# Patient Record
Sex: Female | Born: 1935 | Race: White | Hispanic: Yes | Marital: Single | Smoking: Never smoker
Health system: Southern US, Community
[De-identification: ages and names within clinical notes are randomized; demographics above are authoritative.]

## PROBLEM LIST (undated history)

## (undated) DIAGNOSIS — I10 Essential (primary) hypertension: Secondary | ICD-10-CM

## (undated) DIAGNOSIS — J690 Pneumonitis due to inhalation of food and vomit: Secondary | ICD-10-CM

## (undated) DIAGNOSIS — B372 Candidiasis of skin and nail: Secondary | ICD-10-CM

## (undated) DIAGNOSIS — Z9981 Dependence on supplemental oxygen: Secondary | ICD-10-CM

## (undated) DIAGNOSIS — J9691 Respiratory failure, unspecified with hypoxia: Secondary | ICD-10-CM

## (undated) DIAGNOSIS — L309 Dermatitis, unspecified: Secondary | ICD-10-CM

## (undated) DIAGNOSIS — R1310 Dysphagia, unspecified: Secondary | ICD-10-CM

## (undated) DIAGNOSIS — F0150 Vascular dementia without behavioral disturbance: Secondary | ICD-10-CM

## (undated) DIAGNOSIS — I739 Peripheral vascular disease, unspecified: Secondary | ICD-10-CM

## (undated) HISTORY — PX: VASCULAR SURGERY: SHX849

## (undated) HISTORY — DX: Dysphagia, unspecified: R13.10

## (undated) HISTORY — DX: Peripheral vascular disease, unspecified: I73.9

## (undated) HISTORY — DX: Candidiasis of skin and nail: B37.2

## (undated) HISTORY — DX: Dependence on supplemental oxygen: Z99.81

## (undated) HISTORY — DX: Pneumonitis due to inhalation of food and vomit: J69.0

## (undated) HISTORY — DX: Dermatitis, unspecified: L30.9

## (undated) HISTORY — DX: Essential (primary) hypertension: I10

## (undated) HISTORY — DX: Vascular dementia, unspecified severity, without behavioral disturbance, psychotic disturbance, mood disturbance, and anxiety: F01.50

## (undated) HISTORY — DX: Respiratory failure, unspecified with hypoxia: J96.91

---

## 2011-10-29 ENCOUNTER — Emergency Department (HOSPITAL_COMMUNITY)
Admission: EM | Admit: 2011-10-29 | Discharge: 2011-10-30 | Disposition: A | Discharge status: Home / Self Care | Payer: Medicare Other | Attending: Emergency Medicine | Admitting: Emergency Medicine

## 2011-10-29 ENCOUNTER — Encounter (HOSPITAL_COMMUNITY): Payer: Self-pay | Admitting: Emergency Medicine

## 2011-10-29 DIAGNOSIS — F29 Unspecified psychosis not due to a substance or known physiological condition: Secondary | ICD-10-CM | POA: Insufficient documentation

## 2011-10-29 DIAGNOSIS — F17200 Nicotine dependence, unspecified, uncomplicated: Secondary | ICD-10-CM | POA: Insufficient documentation

## 2011-10-29 DIAGNOSIS — I10 Essential (primary) hypertension: Secondary | ICD-10-CM | POA: Insufficient documentation

## 2011-10-29 DIAGNOSIS — F0390 Unspecified dementia without behavioral disturbance: Secondary | ICD-10-CM | POA: Insufficient documentation

## 2011-10-29 DIAGNOSIS — Z7982 Long term (current) use of aspirin: Secondary | ICD-10-CM | POA: Insufficient documentation

## 2011-10-29 HISTORY — DX: Essential (primary) hypertension: I10

## 2011-10-29 LAB — URINALYSIS, ROUTINE W REFLEX MICROSCOPIC
Glucose, UA: NEGATIVE mg/dL
Hgb urine dipstick: NEGATIVE
Protein, ur: NEGATIVE mg/dL
pH: 7 (ref 5.0–8.0)

## 2011-10-29 LAB — CBC
Hemoglobin: 14.2 g/dL (ref 12.0–15.0)
MCH: 31.9 pg (ref 26.0–34.0)
MCV: 95.1 fL (ref 78.0–100.0)
RBC: 4.45 MIL/uL (ref 3.87–5.11)

## 2011-10-29 LAB — POCT I-STAT, CHEM 8
BUN: 20 mg/dL (ref 6–23)
Calcium, Ion: 1.13 mmol/L (ref 1.12–1.32)
Chloride: 105 mEq/L (ref 96–112)
Glucose, Bld: 138 mg/dL — ABNORMAL HIGH (ref 70–99)
TCO2: 31 mmol/L (ref 0–100)

## 2011-10-29 LAB — DIFFERENTIAL
Eosinophils Absolute: 0.2 10*3/uL (ref 0.0–0.7)
Eosinophils Relative: 2 % (ref 0–5)
Lymphs Abs: 2.3 10*3/uL (ref 0.7–4.0)
Monocytes Relative: 8 % (ref 3–12)
Neutrophils Relative %: 67 % (ref 43–77)

## 2011-10-29 LAB — URINE MICROSCOPIC-ADD ON

## 2011-10-29 MED ORDER — AMLODIPINE BESYLATE 5 MG PO TABS
5.0000 mg | ORAL_TABLET | ORAL | Status: AC
  Administered 2011-10-30: 5 mg via ORAL
  Filled 2011-10-29: qty 1

## 2011-10-29 MED ORDER — METOPROLOL TARTRATE 25 MG PO TABS
12.5000 mg | ORAL_TABLET | Freq: Two times a day (BID) | ORAL | Status: DC
  Administered 2011-10-30: via ORAL
  Administered 2011-10-30: 25 mg via ORAL
  Filled 2011-10-29 (×2): qty 1

## 2011-10-29 MED ORDER — RISPERIDONE 0.5 MG PO TABS
0.5000 mg | ORAL_TABLET | ORAL | Status: AC
  Administered 2011-10-30: 0.5 mg via ORAL
  Filled 2011-10-29: qty 1

## 2011-10-29 NOTE — ED Notes (Signed)
Pt's daughter st's she brought pt to [place] from [place] where she lives due to pt having dementia, will not take B/P meds.  Pt becoming more confused.  Pt combative at times.  St's pt will take her meds then will turn her head and spit them out. Pt's daughter wishes to have pt placed in appropriate facility.

## 2011-10-29 NOTE — ED Notes (Signed)
Spoke with pt and her dtr.  Dtr/family unable to provide 24 hour care for pt and are concerned for her health/safety.  Dtr. Particularly worried about pt's high blood pressure/having a stroke.  Pt lived in a senior income adjusted apt complex in [place], pta.  Pt believes that the complex was a movie set and that people who lived there were actors in a movie.  Pt also believes someone was stealing food from her (she wasn't buying any) and a neighbor got inside of her body.  CSW rec. ALF placement for pt at d/c.  ALF list given to pt's dtr. Pt has [place] Medicaid and will need to have it switched to [place], in order for her to have benefits here, dtr. Informed on how to do this.  CSW will follow in house for placement, if admitted.

## 2011-10-29 NOTE — ED Notes (Signed)
Nursing comuntication completed- charted in error.

## 2011-10-29 NOTE — ED Provider Notes (Signed)
History     CSN: [id number]  Arrival date & time 10/29/11  1252   None     Chief Complaint  Patient presents with  . Hypertension    (Consider location/radiation/quality/duration/timing/severity/associated sxs/prior treatment) HPI This 78 year old female was brought from [place] she was in the hospital for confusion which appears long-standing likely for months with weight loss likely over 10 pounds for one several months with inability to care for herself at home according to the family, apparently when she was in her called the police almost on a daily basis concerned about break-ins people stealing her food, return to the family the patient may not be spending much if any money and food in the first place her although she keeps it home has been losing weight. There is been no acute change the patient's mental status. She remains pleasantly confused. She has very poor short-term memory. She denies headache chest pain palpitations cough shortness breath abdominal pain vomiting diarrhea or focal neurologic symptoms. The family took the patient AGAINST MEDICAL ADVICE of the [place] hospital to move to [place] where they could find assisted living placement for dementia patients in [place]. The family does not recall ever talking to social work or care management in [place] regarding placement for the patient they do notice it appears unsafe for her to try to return home alone. Past Medical History  Diagnosis Date  . Hypertension   Dementia  Past Surgical History  Procedure Date  . Vascular surgery     No family history on file.  History  Substance Use Topics  . Smoking status: Current Some Day Smoker  . Smokeless tobacco: Not on file  . Alcohol Use: No    OB History    Grav Para Term Preterm Abortions TAB SAB Ect Mult Living                  Review of Systems  Constitutional: Negative for fever.       10 Systems reviewed and are negative for acute change except as  noted in the HPI.  HENT: Negative for congestion.   Eyes: Negative for discharge and redness.  Respiratory: Negative for cough and shortness of breath.   Cardiovascular: Negative for chest pain.  Gastrointestinal: Negative for vomiting and abdominal pain.  Musculoskeletal: Negative for back pain.  Skin: Negative for rash.  Neurological: Negative for syncope, numbness and headaches.  Psychiatric/Behavioral:       No behavior change.    Allergies  Sulfa drugs cross reactors  Home Medications   Current Outpatient Rx  Name Route Sig Dispense Refill  . AMLODIPINE BESYLATE 5 MG PO TABS Oral Take 5 mg by mouth daily.    . ASPIRIN EC 81 MG PO TBEC Oral Take 81 mg by mouth daily.    Marland Kitchen METOPROLOL TARTRATE 25 MG PO TABS Oral Take 12.5 mg by mouth 2 (two) times daily.    . ADULT MULTIVITAMIN W/MINERALS CH Oral Take 1 tablet by mouth daily.    . QUETIAPINE FUMARATE 25 MG PO TABS Oral Take 25 mg by mouth at bedtime.      BP 131/91  Pulse 86  Temp(Src) 98.6 F (37 C) (Oral)  Resp 18  SpO2 95%  Physical Exam  Nursing note and vitals reviewed. Constitutional:       Awake, alert, nontoxic appearance with baseline speech for patient.  HENT:  Head: Atraumatic.  Mouth/Throat: No oropharyngeal exudate.  Eyes: EOM are normal. Pupils are equal,  round, and reactive to light. Right eye exhibits no discharge. Left eye exhibits no discharge.  Neck: Neck supple.  Cardiovascular: Normal rate and regular rhythm.   No murmur heard. Pulmonary/Chest: Effort normal and breath sounds normal. No stridor. No respiratory distress. She has no wheezes. She has no rales. She exhibits no tenderness.  Abdominal: Soft. Bowel sounds are normal. She exhibits no mass. There is no tenderness. There is no rebound.  Musculoskeletal: She exhibits no tenderness.       Baseline ROM, moves extremities with no obvious new focal weakness.  Lymphadenopathy:    She has no cervical adenopathy.  Neurological: She is alert.        Awake, alert, cooperative poor short-term memory and does not remember how long she has been [place] or how long she was in the hospital in [place]; motor strength bilaterally; sensation normal to light touch bilaterally; peripheral visual fields full to confrontation; no facial asymmetry; tongue midline; major cranial nerves appear intact; no pronator drift, normal finger to nose bilaterally  Skin: No rash noted.  Psychiatric: She has a normal mood and affect.    ED Course  Procedures (including critical care time) D/w SW, Care Mgmt, TelePsych, & [facility], family given resources from SW but unable to make safe arrangements for discharge and family with more questions for SW so feel reasonable to keep in ED until SW can re-assess in AM, re-started Pt's new BP meds from [place] and Risperdal per TelePsych recs, family states [place] had her on Seroquel a couple nights which helped agitation too.  In ED Pt intermittently anxious and agitated but repeatedly verbally reassured and re-directed by family and ED staff.  Patient / Family / Caregiver informed of clinical course, understand medical decision-making process, and agree with plan.Pt stable in ED with no significant deterioration in condition.0030 Labs Reviewed  URINALYSIS, ROUTINE W REFLEX MICROSCOPIC - Abnormal; Notable for the following:    Leukocytes, UA TRACE (*)    All other components within normal limits  POCT I-STAT, CHEM 8 - Abnormal; Notable for the following:    Glucose, Bld 138 (*)    All other components within normal limits  CBC  DIFFERENTIAL  URINE MICROSCOPIC-ADD ON   No results found.   1. Dementia       MDM  I doubt any other EMC precluding discharge at this time including, but not necessarily limited to the following:CVA, SBI.        Hurman Horn, MD 10/30/11 2216

## 2011-10-29 NOTE — Progress Notes (Signed)
Consult for placement has been referred to the appropriate department of social work.

## 2011-10-29 NOTE — ED Notes (Signed)
Care assumed. Pt standing by the side of bed talking.family at bs. No c/o any. Vs stable.

## 2011-10-29 NOTE — ED Notes (Signed)
Pt here with high blood pressure and some abnormal behavior according to family pt has been seen [facility] in [place] for possible [employer]

## 2011-10-30 MED ORDER — ZIPRASIDONE MESYLATE 20 MG IM SOLR
10.0000 mg | Freq: Once | INTRAMUSCULAR | Status: AC
  Administered 2011-10-30: 10 mg via INTRAMUSCULAR
  Filled 2011-10-30: qty 20

## 2011-10-30 NOTE — ED Notes (Signed)
Patient again got out of bed, redirected patient to bed

## 2011-10-30 NOTE — ED Notes (Signed)
Spoke w/Hannah, SW, who advised there is a facility who is looking over pt's records and she will contact when she hears something.  Also, states family is on there way to ED.

## 2011-10-30 NOTE — ED Provider Notes (Signed)
Medical screening examination/treatment/procedure(s) were performed by non-physician practitioner and as supervising physician I was immediately available for consultation/collaboration.   Joya Gaskins, MD 10/30/11 [phone]

## 2011-10-30 NOTE — ED Notes (Signed)
Patient attempting to get out of bed, patient was re-oriented and is now eating breakfast.

## 2011-10-30 NOTE — ED Notes (Signed)
Clinical Social Worker made aware that patient remains in the ED and in need of placement.  Reviewed chart and spoke with CM.  Unclear why patient brought down from [place] and unclear why family cannot take care of her.  Attempted to call patient daughter who had phone turned off but CSW left message. Will await call back to discuss patient status as well has disposition.  CSW Augusto Gamble has already met with the family and explained why patient cannot be placed due to her Medicaid.  Patient has out of state Medicaid and cannot be place in a SNF at this time.  ALF was discussed and patient family given ALF list.  Will continue to try and reach family who will have to be the decision maker because of patient's dementia.  MD aware of situation and will continue to work and find appropriate placement for patient.  Ashley Jacobs, MSW LCSW [phone]

## 2011-10-30 NOTE — ED Notes (Signed)
Gavin Pound, sitter at bedside.

## 2011-10-30 NOTE — ED Notes (Signed)
Patient sleeping. No complaints at this time.

## 2011-10-30 NOTE — ED Provider Notes (Signed)
Patient with a hx sig for hypertension was placed in CDU  by Dr. Fonnie Jarvis (from [facility]). Patient care resumed from Dr. Read Drivers .  Patient is here for lack of placement and has been here for observation pending social work consult in the morning. Note that the family took the patient AGAINST MEDICAL ADVICE from [place] hospital to move to [place] where they could find assisted living placement for dementia patients in [place]. Her ins and papers are still for [place] and pt has NOT need registered here for medicare.While in obeservation over night the pt slept well and had no complaints, per nursing staff. Patient re-evaluated and is sleeping comfortable, VSS, no concerns at this time.  Plan per previous provider is to see if social work can assist in finding placement for patient due to family concern for health/safety, and not being comfortable taking patient home. On exam: hemodynamically stable, NAD, heart w/ RRR, lungs CTAB, Chest & abd non-tender, no peripheral edema or calf tenderness. Pt with baseline dementia.   BP 125/50  Pulse 66  Temp(Src) 98.6 F (37 C) (Oral)  Resp 16  SpO2 99%   9:09 AM  Social Work re-paged   10:00 AM Spoke with Dahlia Client the social worker who has a Nursing Home currently looking over pts paper wk for possible placement.   11:00Am Family coming to ED  12:00 Holding off on getting pt admitted. Dr. Freida Busman says that if pt in unable to be placed administration can be contacted to get pt admitted. Placemnt still pending. Pt stable with sitter.   BP 128/79  Pulse 75  Temp(Src) 98.6 F (37 C) (Oral)  Resp 16  SpO2 99%    1:13 PM Placement by 5:00pm or need to get pt admitted, social worker to report back to me by 2:00PM   2:54 PM Social worker unable to place patient patient's family has been given followup care with PCP paperwork as well as nursing homes placement papers.  Family will take patient home and schedule followup as well as find placement for their  mother.  The family is agreeable with this plan patient is hemodynamically stable and in no acute distress prior to discharge.    Jaci Carrel, [place] 10/30/11 1455

## 2011-10-30 NOTE — ED Notes (Signed)
Working on a plan with a local facility for placement for patient.  Have completed referral and hopeful for placement under patient's medicare because we was admitted at [facility].  Will follow up once patient daughter arrives and if facility will accept patient. If facility declines patient because of Medicare then Medicaid will have to be switched over before she can be placed, thus patient daughter will have to take her home.  Will follow up.    Call if you have questions.  Ashley Jacobs, MSW LCSW [phone]

## 2011-10-30 NOTE — ED Notes (Signed)
Patient daughter returned call back.  She is very frazzled and upset with the status of her mother.  She will be coming to the hospital to speak with CSW around 11 am.  At that time will discuss all of patient's options with daughter.  Will follow up.  Ashley Jacobs, MSW LCSW [phone]

## 2011-10-30 NOTE — ED Notes (Signed)
Breakfast ordered 

## 2011-10-30 NOTE — ED Notes (Signed)
Met with patient's daughter and son.  Patient will not be able to be placed due to insurance at this time and no payer source.  Liaison for nursing home also completed meeting with CSW and discussed options as well as reason for denial.    Daughter given information about switching her Medicaid over to [facility] and also an application.  Along with medicaid resources, daughter was given Management consultant number to find patient a PCP and list of ALF, SNF, and private duty sitters.    Plan for patient is to return home with daughter and son at this time until medicaid can be switched, she has been appropriate level of care identified with PCP. PA and RN have been made aware and no other needs at this time.  Anticipate dc this evening.  Ashley Jacobs, MSW LCSW [phone]

## 2011-10-30 NOTE — Discharge Instructions (Signed)
Use the resource guide given do to help he find placement for your family member.

## 2011-10-30 NOTE — ED Notes (Signed)
Lew Dawes, Consulting civil engineer, aware of pt remaining in ED.  Advised she will have Diplomatic Services operational officer page day SW.

## 2012-05-02 ENCOUNTER — Ambulatory Visit
Admission: RE | Admit: 2012-05-02 | Discharge: 2012-05-02 | Disposition: A | Discharge status: Home / Self Care | Payer: No Typology Code available for payment source | Source: Ambulatory Visit | Attending: Family Medicine | Admitting: Family Medicine

## 2012-05-02 ENCOUNTER — Other Ambulatory Visit: Payer: Self-pay | Admitting: Family Medicine

## 2012-05-02 DIAGNOSIS — M549 Dorsalgia, unspecified: Secondary | ICD-10-CM

## 2013-08-25 ENCOUNTER — Encounter (HOSPITAL_COMMUNITY): Payer: Self-pay | Admitting: Emergency Medicine

## 2013-08-25 ENCOUNTER — Emergency Department (HOSPITAL_COMMUNITY): Payer: Medicare (Managed Care)

## 2013-08-25 ENCOUNTER — Emergency Department (HOSPITAL_COMMUNITY)
Admission: EM | Admit: 2013-08-25 | Discharge: 2013-08-25 | Disposition: A | Discharge status: Home / Self Care | Payer: Medicare (Managed Care) | Attending: Emergency Medicine | Admitting: Emergency Medicine

## 2013-08-25 DIAGNOSIS — W19XXXA Unspecified fall, initial encounter: Secondary | ICD-10-CM | POA: Insufficient documentation

## 2013-08-25 DIAGNOSIS — F0390 Unspecified dementia without behavioral disturbance: Secondary | ICD-10-CM | POA: Insufficient documentation

## 2013-08-25 DIAGNOSIS — R05 Cough: Secondary | ICD-10-CM | POA: Insufficient documentation

## 2013-08-25 DIAGNOSIS — Z79899 Other long term (current) drug therapy: Secondary | ICD-10-CM | POA: Insufficient documentation

## 2013-08-25 DIAGNOSIS — F209 Schizophrenia, unspecified: Secondary | ICD-10-CM | POA: Insufficient documentation

## 2013-08-25 DIAGNOSIS — E876 Hypokalemia: Secondary | ICD-10-CM

## 2013-08-25 DIAGNOSIS — R7989 Other specified abnormal findings of blood chemistry: Secondary | ICD-10-CM | POA: Insufficient documentation

## 2013-08-25 DIAGNOSIS — I10 Essential (primary) hypertension: Secondary | ICD-10-CM | POA: Insufficient documentation

## 2013-08-25 DIAGNOSIS — M47817 Spondylosis without myelopathy or radiculopathy, lumbosacral region: Secondary | ICD-10-CM | POA: Insufficient documentation

## 2013-08-25 DIAGNOSIS — R531 Weakness: Secondary | ICD-10-CM

## 2013-08-25 DIAGNOSIS — F17200 Nicotine dependence, unspecified, uncomplicated: Secondary | ICD-10-CM | POA: Insufficient documentation

## 2013-08-25 DIAGNOSIS — R5381 Other malaise: Secondary | ICD-10-CM | POA: Insufficient documentation

## 2013-08-25 DIAGNOSIS — R932 Abnormal findings on diagnostic imaging of liver and biliary tract: Secondary | ICD-10-CM | POA: Insufficient documentation

## 2013-08-25 DIAGNOSIS — M47816 Spondylosis without myelopathy or radiculopathy, lumbar region: Secondary | ICD-10-CM

## 2013-08-25 DIAGNOSIS — Y92009 Unspecified place in unspecified non-institutional (private) residence as the place of occurrence of the external cause: Secondary | ICD-10-CM | POA: Insufficient documentation

## 2013-08-25 DIAGNOSIS — Y939 Activity, unspecified: Secondary | ICD-10-CM | POA: Insufficient documentation

## 2013-08-25 DIAGNOSIS — R296 Repeated falls: Secondary | ICD-10-CM

## 2013-08-25 DIAGNOSIS — R059 Cough, unspecified: Secondary | ICD-10-CM | POA: Insufficient documentation

## 2013-08-25 DIAGNOSIS — Z7982 Long term (current) use of aspirin: Secondary | ICD-10-CM | POA: Insufficient documentation

## 2013-08-25 LAB — CBC WITH DIFFERENTIAL/PLATELET
Basophils Relative: 0 % (ref 0–1)
Eosinophils Absolute: 0.1 10*3/uL (ref 0.0–0.7)
Eosinophils Relative: 1 % (ref 0–5)
HCT: 36.4 % (ref 36.0–46.0)
Hemoglobin: 11.7 g/dL — ABNORMAL LOW (ref 12.0–15.0)
MCH: 30.5 pg (ref 26.0–34.0)
MCHC: 32.1 g/dL (ref 30.0–36.0)
MCV: 95 fL (ref 78.0–100.0)
Monocytes Absolute: 0.9 10*3/uL (ref 0.1–1.0)
Monocytes Relative: 9 % (ref 3–12)

## 2013-08-25 LAB — URINALYSIS, ROUTINE W REFLEX MICROSCOPIC
Bilirubin Urine: NEGATIVE
Glucose, UA: NEGATIVE mg/dL
Ketones, ur: 15 mg/dL — AB
pH: 7.5 (ref 5.0–8.0)

## 2013-08-25 LAB — COMPREHENSIVE METABOLIC PANEL
Albumin: 3.2 g/dL — ABNORMAL LOW (ref 3.5–5.2)
BUN: 19 mg/dL (ref 6–23)
Chloride: 99 mEq/L (ref 96–112)
Creatinine, Ser: 0.72 mg/dL (ref 0.50–1.10)
Total Bilirubin: 0.8 mg/dL (ref 0.3–1.2)

## 2013-08-25 LAB — URINE MICROSCOPIC-ADD ON

## 2013-08-25 MED ORDER — POTASSIUM CHLORIDE CRYS ER 20 MEQ PO TBCR
40.0000 meq | EXTENDED_RELEASE_TABLET | Freq: Once | ORAL | Status: AC
  Administered 2013-08-25: 40 meq via ORAL
  Filled 2013-08-25: qty 2

## 2013-08-25 MED ORDER — SODIUM CHLORIDE 0.9 % IV BOLUS (SEPSIS)
500.0000 mL | Freq: Once | INTRAVENOUS | Status: AC
  Administered 2013-08-25: 500 mL via INTRAVENOUS

## 2013-08-25 MED ORDER — POTASSIUM CHLORIDE CRYS ER 20 MEQ PO TBCR: 40.0000 meq | EXTENDED_RELEASE_TABLET | Freq: Once | ORAL | Status: DC

## 2013-08-25 NOTE — ED Provider Notes (Signed)
CSN: [id number]     Arrival date & time 08/25/13  7829 History   First MD Initiated Contact with Patient 08/25/13 [phone]     Chief Complaint  Patient presents with  . Fall  . Hematuria   (Consider location/radiation/quality/duration/timing/severity/associated sxs/prior Treatment) HPI Comments: 79 yo female with dementia, low back pain, htn presents with falls and hematuria.  Pt has had more frequent falls the past few weeks, lives alone in apartment, [facility] has been following to place in NH.  Pt says her lower back pain more severe and the past week her lower legs weak to where it is hard to get around.  She has access to walker.   No head injury.  No fevers.  No dysuria.  Takes her hrs to get up at a time.   Patient is a 79 y.o. female presenting with fall and hematuria. The history is provided by the patient.  Fall Pertinent negatives include no chest pain, no abdominal pain, no headaches and no shortness of breath.  Hematuria Pertinent negatives include no chest pain, no abdominal pain, no headaches and no shortness of breath.    Past Medical History  Diagnosis Date  . Hypertension    Past Surgical History  Procedure Laterality Date  . Vascular surgery     No family history on file. History  Substance Use Topics  . Smoking status: Current Some Day Smoker  . Smokeless tobacco: Not on file  . Alcohol Use: No   OB History   Grav Para Term Preterm Abortions TAB SAB Ect Mult Living                 Review of Systems  Constitutional: Negative for fever and chills.  HENT: Negative for congestion.   Eyes: Negative for visual disturbance.  Respiratory: Positive for cough. Negative for shortness of breath.   Cardiovascular: Negative for chest pain.  Gastrointestinal: Negative for vomiting and abdominal pain.  Genitourinary: Positive for hematuria. Negative for dysuria and flank pain.  Musculoskeletal: Positive for arthralgias and back pain. Negative for neck pain and neck  stiffness.  Skin: Negative for rash.  Neurological: Positive for weakness. Negative for light-headedness, numbness and headaches.    Allergies  Sulfa drugs cross reactors  Home Medications   Current Outpatient Rx  Name  Route  Sig  Dispense  Refill  . amLODipine (NORVASC) 5 MG tablet   Oral   Take 5 mg by mouth daily.         Marland Kitchen aspirin EC 81 MG tablet   Oral   Take 81 mg by mouth daily.         . metoprolol tartrate (LOPRESSOR) 25 MG tablet   Oral   Take 12.5 mg by mouth 2 (two) times daily.         . Multiple Vitamin (MULITIVITAMIN WITH MINERALS) TABS   Oral   Take 1 tablet by mouth daily.         . QUEtiapine (SEROQUEL) 25 MG tablet   Oral   Take 25 mg by mouth at bedtime.          BP 138/53  Pulse 63  Temp(Src) 98.1 F (36.7 C) (Oral)  Resp 16  SpO2 94% Physical Exam  Nursing note and vitals reviewed. Constitutional: She is oriented to person, place, and time. She appears well-developed and well-nourished.  HENT:  Head: Normocephalic and atraumatic.  Eyes: Conjunctivae are normal. Right eye exhibits no discharge. Left eye exhibits no discharge.  Neck: Normal range of motion. Neck supple. No tracheal deviation present.  Cardiovascular: Normal rate and regular rhythm.   Pulmonary/Chest: Effort normal and breath sounds normal.  Abdominal: Soft. She exhibits no distension. There is no tenderness. There is no guarding.  Musculoskeletal: She exhibits no edema.  Neurological: She is alert and oriented to person, place, and time.  Reflex Scores:      Patellar reflexes are 2+ on the right side and 2+ on the left side.      Achilles reflexes are 2+ on the right side and 2+ on the left side. Pleasant mild dementia- knows city and location, unsure her address or date 5+ strength in UE and 4+ LE with f/e at major joints. Sensation to palpation intact in UE and LE. CNs 2-12 grossly intact.  EOMFI.  PERRL.   Finger nose and coordination intact bilateral.    Visual fields intact to finger testing.   Skin: Skin is warm. No rash noted.  Psychiatric: She has a normal mood and affect.    ED Course  Procedures (including critical care time) Labs Review Labs Reviewed  CBC WITH DIFFERENTIAL - Abnormal; Notable for the following:    RBC 3.83 (*)    Hemoglobin 11.7 (*)    Neutrophils Relative % 80 (*)    Neutro Abs 7.8 (*)    Lymphocytes Relative 10 (*)    All other components within normal limits  URINE CULTURE  URINALYSIS, ROUTINE W REFLEX MICROSCOPIC  COMPREHENSIVE METABOLIC PANEL  CK   Imaging Review Dg Chest 2 View  08/25/2013   CLINICAL DATA:  Shortness of breath  EXAM: CHEST  2 VIEW  COMPARISON:  None.  FINDINGS: There is a calcified 15 mm pulmonary nodule in the superior segment of the left lower lobe likely reflecting sequela of prior granulomatous disease. There is no focal parenchymal opacity, pleural effusion, or pneumothorax. The heart and mediastinal contours are unremarkable.  There is mild thoracic spine spondylosis.  IMPRESSION: No active cardiopulmonary disease.   Electronically Signed   By: Elige Ko   On: 08/25/2013 12:17   Mr Lumbar Spine Wo Contrast  08/25/2013   CLINICAL DATA:  Low back pain for 2 years. Worsening lower extremity weakness.  EXAM: MRI LUMBAR SPINE WITHOUT CONTRAST  TECHNIQUE: Multiplanar, multisequence MR imaging was performed. No intravenous contrast was administered.  COMPARISON:  05/02/2012  FINDINGS: Dilated common bile duct at 1.7 cm. Right kidney lower pole slightly T2 hyperintense lesion, 0.6 cm, nonspecific.  There is considerable dextroconvex lumbar scoliosis.  The lowest lumbar type non-rib-bearing vertebra is labeled as L5. The conus medullaris appears normal. Conus level: L1.  Type 1 degenerative endplate findings noted eccentric to the left at L2-3 with loss of disc height. Probable hemangioma in the left pedicle of T11. Cystic lesion eccentric to the left in the anatomic pelvis measuring at  least 2.4 cm in short axis, imaging characteristics probably benign.  There is 3 mm of degenerative posterior subluxation at L2-3 and L3-4.  Additional findings at individual levels are as follows:  L1-2:  No impingement.  Mild disc bulge.  L2-3: Mild left subarticular lateral recess stenosis due to facet arthropathy and left lateral recess and foraminal disc protrusion.  L3-4:  No impingement.  Disc bulge and facet arthropathy noted.  L4-5: Mild right and borderline left foraminal stenosis with borderline bilateral subarticular lateral recess stenosis due to diffuse disc bulge and facet arthropathy.  L5-S1: Mild to moderate right foraminal stenosis due to right foraminal  disc osteophyte complex and facet arthropathy.  IMPRESSION: 1. Lumbar spondylosis, scoliosis, and degenerative disc disease, causing mild to moderate impingement at L5-S1 and mild impingement at L2-3 and L4-5, as detailed above. 2. Dilated common bile duct partially visualized. Consider ultrasound for further characterization. 3. 2.4 cm probably benign left ovarian cyst. Given the patient's age, pelvic sonography is recommended for further characterization. This recommendation follows ACR consensus guidelines: White Paper of the ACR Incidental Findings Committee II on Adnexal Findings. J Am Coll Radiol [phone].   Electronically Signed   By: Herbie Baltimore M.D.   On: 08/25/2013 11:33   US Abdomen Complete  08/25/2013   CLINICAL DATA:  Elevated liver function tests. Dilated common bile duct seen on MRI.  EXAM: ULTRASOUND ABDOMEN COMPLETE  COMPARISON:  MRI lumbar spine earlier today. Lumbar spine plain films 05/02/2012.  FINDINGS: Gallbladder:  There was no visible gallbladder lumen or gallstones observed. There was shadowing in the right upper quadrant. Porcelain Gallbladder is suspected. No sonographic Murphy sign noted.  Common bile duct:  Diameter: Enlarged head 10.7 mm.  Liver:  No focal lesion identified. Within normal limits in  parenchymal echogenicity.  IVC:  No abnormality visualized.  Pancreas:  Visualized portion unremarkable.  Overall somewhat heterogeneous.  Spleen:  Size and appearance within normal limits.  Right Kidney:  Length: 9.1 cm. Small lower pole cyst measuring 7 x 7 x 8 mm. Echogenicity within normal limits. No mass or hydronephrosis visualized.  Left Kidney:  Length: 10.3 cm. Echogenicity within normal limits. No mass or hydronephrosis visualized.  Abdominal aorta:  No aneurysm visualized.  Other findings:  None.  IMPRESSION: Question porcelain gallbladder. Biliary ductal dilatation up to 11 mm. Consider CT abdomen and pelvis with oral and IV contrast for further evaluation.   Electronically Signed   By: Davonna Belling M.D.   On: 08/25/2013 15:59    EKG Interpretation   None       MDM   1. Frequent falls   2. Hypokalemia   3. LFT elevation   4. General weakness   5. Arthritis, lumbar spine   Porcelain gallbladder  Left ovarian cyst   Discussed with [facility], phone [phone]. Plan in ED to rule out medical cause for weakness and falls. [facility] having social worker arrange NH placement.  Pain meds given. Fluids given.  Xray and MRI lumbar.  MRI showed arthritis, mild degeneration and impingement.   Pt pain improved on recheck, no weakness, no need for NSGY consult emergently.  MRI showed dilated CBD, LFT elevated, spoke with surgery with concern for porcelain GB - Dr Janee Morn recommended outpatient fup with Dr Ovidio Kin or another g surgeon.   Social work assisting for skilled nursing and out patient fup. Spoke with [facility] and Child psychotherapist, pt is cleared for nursing facility.   Results and differential diagnosis were discussed with the patient. Close follow up outpatient was discussed, patient comfortable with the plan.   Diagnosis: above    Enid Skeens, MD 08/25/13 [phone]

## 2013-08-25 NOTE — ED Notes (Signed)
Bed: WA23 Expected date:  Expected time:  Means of arrival:  Comments: EMS 

## 2013-08-25 NOTE — Progress Notes (Signed)
[facility] provided MD with med list. CSW provided to RN>   Those meds include:  abilify 10 mg, qd  acetaminophe-hydrocodone 325 mg-10 mg bid  amloidpine besylate 10 mg qd  Artificial tears 1 gtt in each eye tid prn  eucerine cream 1 applicatior applied bid to lower legs 1:1 with fluocinonide topical 0.05% cream 1 app applicated topically bid to exczema type rash on sacrum and behind ears and to rash on lower legs.  Fluoexetine 20 mg  1 tabs orally once a day  Furosemide 20 mg  1 tabl orally once da day  Senna 50 mg 8.6 mg 1 tabl orally once a day at bedtime.   [name], LCSW [phone]  ED CSW  .08/25/2013 1629pm

## 2013-08-25 NOTE — ED Notes (Signed)
Per EMS-pt from independent living, Annoiteded [employer] c/o of fall last night. Denies pain. Also noticed blood in urine this morning. AAO

## 2013-08-25 NOTE — Progress Notes (Addendum)
CSW spoke with pace worker, [name], who stated she is working with patient to go to ALF, [facility]. However are awaiting further medical evaluation to determine if patient will still be appropriate int he next few days or if she will need skilled nursing at [name]. CSW will discuss further with MD regarding disposition.   Frutoso Schatz [phone]  ED CSW .08/25/2013 1517pm   CSW spoke with patient daughter who agrees with patient plan of discharging to skilled nursing once patient is medically stable. [name] from [name] spoke with [name] from [place] who states that there is availability. CSW spoke with patient MD who agrees with plan. CSW completing fl2 for md signature.   Frutoso Schatz [phone]  ED CSW .08/25/2013 1555pm    CSW spoke with [name], explaining that patient is still awaiting further medical evaluation. Per [name], patient can come tonight as long as they have all the medicaitons. Per discussion with [facility] they gave the medicaitons to the nurse however these are not entered into epic.   [name], LCSW [phone]  ED CSW .08/25/2013 1517pm    CSW sent medicaitons to [place], however due to being late in the evening an still no word on admission or medically stable, [name] unsure if patient can go to snf today. CSW awaiting call back. CSW spoke with RN and is awaiting to hear if patient will be admitted or not. Fl2 in chart signed.   [name], LCSW [phone]  ED CSW .08/25/2013 1636pm

## 2013-08-25 NOTE — ED Notes (Signed)
Patient transported to MRI 

## 2013-08-25 NOTE — Progress Notes (Signed)
Pt medically stable for [facility] to [facility]. Patient fl2 signed and with chart. Patient to be transported by [facility]. RN to arrange transportation with [facility].    [name], LCSW [phone]  ED CSW .08/25/2013 1647pm

## 2013-08-26 LAB — URINE CULTURE: Colony Count: 50000

## 2014-03-08 ENCOUNTER — Emergency Department (HOSPITAL_COMMUNITY): Payer: Medicare (Managed Care)

## 2014-03-08 ENCOUNTER — Emergency Department (HOSPITAL_COMMUNITY)
Admission: EM | Admit: 2014-03-08 | Discharge: 2014-03-08 | Disposition: A | Discharge status: Home / Self Care | Payer: Medicare (Managed Care) | Attending: Emergency Medicine | Admitting: Emergency Medicine

## 2014-03-08 ENCOUNTER — Encounter (HOSPITAL_COMMUNITY): Payer: Self-pay | Admitting: Emergency Medicine

## 2014-03-08 DIAGNOSIS — Z79899 Other long term (current) drug therapy: Secondary | ICD-10-CM | POA: Insufficient documentation

## 2014-03-08 DIAGNOSIS — Y92129 Unspecified place in nursing home as the place of occurrence of the external cause: Secondary | ICD-10-CM

## 2014-03-08 DIAGNOSIS — W19XXXA Unspecified fall, initial encounter: Secondary | ICD-10-CM

## 2014-03-08 DIAGNOSIS — F0390 Unspecified dementia without behavioral disturbance: Secondary | ICD-10-CM | POA: Insufficient documentation

## 2014-03-08 DIAGNOSIS — Y9389 Activity, other specified: Secondary | ICD-10-CM | POA: Insufficient documentation

## 2014-03-08 DIAGNOSIS — Y9210 Unspecified residential institution as the place of occurrence of the external cause: Secondary | ICD-10-CM | POA: Insufficient documentation

## 2014-03-08 DIAGNOSIS — R296 Repeated falls: Secondary | ICD-10-CM | POA: Insufficient documentation

## 2014-03-08 DIAGNOSIS — I10 Essential (primary) hypertension: Secondary | ICD-10-CM | POA: Insufficient documentation

## 2014-03-08 DIAGNOSIS — IMO0002 Reserved for concepts with insufficient information to code with codable children: Secondary | ICD-10-CM | POA: Insufficient documentation

## 2014-03-08 DIAGNOSIS — F17200 Nicotine dependence, unspecified, uncomplicated: Secondary | ICD-10-CM | POA: Insufficient documentation

## 2014-03-08 LAB — CK: Total CK: 71 U/L (ref 7–177)

## 2014-03-08 NOTE — ED Notes (Signed)
[facility] Expected date:  Expected time:  Means of arrival:  Comments: EMS- fall, low back pain

## 2014-03-08 NOTE — Discharge Instructions (Signed)
Please follow with your primary care doctor in the next 2 days for a check-up. They must obtain records for further management.  ° °Do not hesitate to return to the Emergency Department for any new, worsening or concerning symptoms.  ° °

## 2014-03-08 NOTE — ED Notes (Signed)
Family at bedside. 

## 2014-03-08 NOTE — ED Provider Notes (Signed)
CSN: [id number]     Arrival date & time 03/08/14  1850 History   None    Chief Complaint  Patient presents with  . Fall  . Back Pain     (Consider location/radiation/quality/duration/timing/severity/associated sxs/prior Treatment) HPI  Kristy Cruz is a 80 y.o. female was severe dementia, accompanied by daughter brought in by EMS from [facility] on [address]. Patient was found down this morning between the toilet and the bathtub. Patient has history of severe dementia. Level V caveat.  Past Medical History  Diagnosis Date  . Hypertension    Past Surgical History  Procedure Laterality Date  . Vascular surgery     History reviewed. No pertinent family history. History  Substance Use Topics  . Smoking status: Current Some Day Smoker  . Smokeless tobacco: Not on file  . Alcohol Use: No   OB History   Grav Para Term Preterm Abortions TAB SAB Ect Mult Living                 Review of Systems  Unable to perform ROS: Dementia      Allergies  Sulfa drugs cross reactors  Home Medications   Prior to Admission medications   Medication Sig Start Date End Date Taking? Authorizing Provider  ARIPiprazole (ABILIFY) 10 MG tablet Take 10 mg by mouth daily.   Yes Historical Provider, MD  fluocinonide cream (LIDEX) 0.05 % Apply 1 application topically 2 (two) times daily.   Yes Historical Provider, MD  FLUoxetine (PROZAC) 20 MG capsule Take 20 mg by mouth daily.   Yes Historical Provider, MD  furosemide (LASIX) 20 MG tablet Take 20 mg by mouth daily.   Yes Historical Provider, MD  HYDROcodone-acetaminophen (NORCO) 7.5-325 MG per tablet Take 1 tablet by mouth 2 (two) times daily.   Yes Historical Provider, MD  ibuprofen (ADVIL,MOTRIN) 400 MG tablet Take 400 mg by mouth 2 (two) times daily.   Yes Historical Provider, MD  senna-docusate (SENOKOT-S) 8.6-50 MG per tablet Take 1 tablet by mouth every other day.   Yes Historical Provider, MD  Skin Protectants, Misc. (EUCERIN) cream  Apply 1 application topically 2 (two) times daily.   Yes Historical Provider, MD   BP 182/78  Pulse 71  Temp(Src) 97.9 F (36.6 C) (Oral)  Resp 16  SpO2 94% Physical Exam  Nursing note and vitals reviewed. Constitutional: She appears well-developed and well-nourished. No distress.  HENT:  Head: Normocephalic and atraumatic.  Mouth/Throat: Oropharynx is clear and moist.  Eyes: Conjunctivae and EOM are normal. Pupils are equal, round, and reactive to light.  Neck: Normal range of motion.  Cardiovascular: Normal rate, regular rhythm, normal heart sounds and intact distal pulses.   Pulmonary/Chest: Effort normal and breath sounds normal. No stridor.  Abdominal: Soft. She exhibits no distension and no mass. There is no tenderness. There is no rebound and no guarding.  Musculoskeletal: Normal range of motion.  Neurological: She is alert.  Follows very simple commands, moving all extremities,  Psychiatric: She has a normal mood and affect.    ED Course  Procedures (including critical care time) Labs Review Labs Reviewed  CK    Imaging Review Dg Chest 2 View  03/08/2014   CLINICAL DATA:  Chest pain.  EXAM: CHEST  2 VIEW  COMPARISON:  August 25, 2013.  FINDINGS: The heart size and mediastinal contours are within normal limits. No pneumothorax or pleural effusion is noted. Stable calcified granuloma seen in left lower lobe. No acute pulmonary disease is noted.  The visualized skeletal structures are unremarkable.  IMPRESSION: No acute cardiopulmonary abnormality seen.   Electronically Signed   By: Roque Lias M.D.   On: 03/08/2014 19:40   Dg Lumbar Spine Complete  03/08/2014   CLINICAL DATA:  Lower back pain after fall.  EXAM: LUMBAR SPINE - COMPLETE 4+ VIEW  COMPARISON:  None.  FINDINGS: Moderate dextroscoliosis of upper lumbar spine is noted. No fracture or significant spondylolisthesis is noted. Degenerative disc disease is noted at L2-3 and L3-4. Atherosclerotic calcifications of  abdominal aorta are noted. Diffuse osteopenia is noted.  IMPRESSION: Multilevel degenerative disc disease. No acute abnormality seen in the lumbar spine.   Electronically Signed   By: Roque Lias M.D.   On: 03/08/2014 19:41   Ct Head Wo Contrast  03/08/2014   CLINICAL DATA:  Pain post trauma  EXAM: CT HEAD WITHOUT CONTRAST  CT CERVICAL SPINE WITHOUT CONTRAST  TECHNIQUE: Multidetector CT imaging of the head and cervical spine was performed following the standard protocol without intravenous contrast. Multiplanar CT image reconstructions of the cervical spine were also generated.  COMPARISON:  None.  FINDINGS: CT HEAD FINDINGS  There is mild diffuse atrophy. There is no mass, hemorrhage, extra-axial fluid collection, or midline shift. There is patchy small vessel disease in the centra semiovale bilaterally. Elsewhere, gray-white compartments appear normal. No acute infarct is apparent. Bony calvarium appears intact. The mastoid air cells are clear.  CT CERVICAL SPINE FINDINGS  There is no fracture or spondylolisthesis. Prevertebral soft tissues and predental space regions are normal. There is moderate disc space narrowing at C5-6. There is slight disc space narrowing at C4-5 and C6-7. There is facet osteoarthritic change at multiple levels bilaterally. No disc extrusion or stenosis. There is a benign appearing cyst in the right side of the C2 vertebral body. There is carotid artery calcification bilaterally.  IMPRESSION: CT head: Atrophy with patchy periventricular small vessel disease. No intracranial mass, hemorrhage, or extra-axial fluid.  CT cervical spine: Multifocal osteoarthritic change. Benign appearing cyst in the right C2 vertebral body. No fracture or spondylolisthesis. Carotid artery calcification bilaterally.   Electronically Signed   By: Bretta Bang M.D.   On: 03/08/2014 20:59   Ct Cervical Spine Wo Contrast  03/08/2014   CLINICAL DATA:  Pain post trauma  EXAM: CT HEAD WITHOUT CONTRAST  CT  CERVICAL SPINE WITHOUT CONTRAST  TECHNIQUE: Multidetector CT imaging of the head and cervical spine was performed following the standard protocol without intravenous contrast. Multiplanar CT image reconstructions of the cervical spine were also generated.  COMPARISON:  None.  FINDINGS: CT HEAD FINDINGS  There is mild diffuse atrophy. There is no mass, hemorrhage, extra-axial fluid collection, or midline shift. There is patchy small vessel disease in the centra semiovale bilaterally. Elsewhere, gray-white compartments appear normal. No acute infarct is apparent. Bony calvarium appears intact. The mastoid air cells are clear.  CT CERVICAL SPINE FINDINGS  There is no fracture or spondylolisthesis. Prevertebral soft tissues and predental space regions are normal. There is moderate disc space narrowing at C5-6. There is slight disc space narrowing at C4-5 and C6-7. There is facet osteoarthritic change at multiple levels bilaterally. No disc extrusion or stenosis. There is a benign appearing cyst in the right side of the C2 vertebral body. There is carotid artery calcification bilaterally.  IMPRESSION: CT head: Atrophy with patchy periventricular small vessel disease. No intracranial mass, hemorrhage, or extra-axial fluid.  CT cervical spine: Multifocal osteoarthritic change. Benign appearing cyst in the right C2 vertebral  body. No fracture or spondylolisthesis. Carotid artery calcification bilaterally.   Electronically Signed   By: Bretta BangWilliam  Woodruff M.D.   On: 03/08/2014 20:59     EKG Interpretation None      MDM   Final diagnoses:  Fall at nursing home, initial encounter    Filed Vitals:   03/08/14 1905  BP: 182/78  Pulse: 71  Temp: 97.9 F (36.6 C)  TempSrc: Oral  Resp: 16  SpO2: 94%    Vernon Preyrene Mas is a 80 y.o. female presenting with fall in nursing home. Patient is mentating at her baseline as per her daughter. Exam is limited secondary to severe dementia. CT head, C-spine and x-ray of chest  and lumbar spine with no acute abnormality. CK is ordered because it is unclear how long the patient was down for a period  This is a shared visit with the attending physician who personally evaluated the patient and agrees with the care plan.   Evaluation does not show pathology that would require ongoing emergent intervention or inpatient treatment. Pt is hemodynamically stable and mentating appropriately. Discussed findings and plan with patient/guardian, who agrees with care plan. All questions answered. Return precautions discussed and outpatient follow up given.     Wynetta Emeryicole Jeury Mcnab, PA-C 03/09/14 [phone]

## 2014-03-08 NOTE — ED Notes (Signed)
PA-C at bedside 

## 2014-03-08 NOTE — ED Notes (Signed)
[facility] [address] [facility], report of a bathroom fall, c/o of back pain no LOC, no obvious hematoma or head injury. Poor historian, hx of dementia.  Facility wants pt checked out per their protocol after falls.

## 2014-03-09 NOTE — ED Provider Notes (Signed)
Medical screening examination/treatment/procedure(s) were performed by non-physician practitioner and as supervising physician I was immediately available for consultation/collaboration.   EKG Interpretation None        Saige Canton M Fatemah Pourciau, MD 03/09/14 0036 

## 2014-08-07 ENCOUNTER — Encounter (HOSPITAL_COMMUNITY): Payer: Self-pay

## 2014-08-07 ENCOUNTER — Emergency Department (HOSPITAL_COMMUNITY)
Admission: EM | Admit: 2014-08-07 | Discharge: 2014-08-08 | Disposition: A | Discharge status: Home / Self Care | Payer: Medicare (Managed Care) | Attending: Emergency Medicine | Admitting: Emergency Medicine

## 2014-08-07 DIAGNOSIS — Y9389 Activity, other specified: Secondary | ICD-10-CM | POA: Insufficient documentation

## 2014-08-07 DIAGNOSIS — Y998 Other external cause status: Secondary | ICD-10-CM | POA: Diagnosis not present

## 2014-08-07 DIAGNOSIS — S3992XA Unspecified injury of lower back, initial encounter: Secondary | ICD-10-CM | POA: Insufficient documentation

## 2014-08-07 DIAGNOSIS — W19XXXA Unspecified fall, initial encounter: Secondary | ICD-10-CM

## 2014-08-07 DIAGNOSIS — Z79899 Other long term (current) drug therapy: Secondary | ICD-10-CM | POA: Diagnosis not present

## 2014-08-07 DIAGNOSIS — Y929 Unspecified place or not applicable: Secondary | ICD-10-CM | POA: Diagnosis not present

## 2014-08-07 DIAGNOSIS — I10 Essential (primary) hypertension: Secondary | ICD-10-CM | POA: Insufficient documentation

## 2014-08-07 DIAGNOSIS — W06XXXA Fall from bed, initial encounter: Secondary | ICD-10-CM | POA: Insufficient documentation

## 2014-08-07 DIAGNOSIS — Z720 Tobacco use: Secondary | ICD-10-CM | POA: Diagnosis not present

## 2014-08-07 DIAGNOSIS — S0990XA Unspecified injury of head, initial encounter: Secondary | ICD-10-CM | POA: Diagnosis not present

## 2014-08-07 DIAGNOSIS — S60812A Abrasion of left wrist, initial encounter: Secondary | ICD-10-CM | POA: Diagnosis not present

## 2014-08-07 NOTE — ED Notes (Signed)
Pt was found in the floor on the side of her bed, only complains of mid back pain, pt alert and oriented per EMS

## 2014-08-07 NOTE — ED Notes (Signed)
Bed: [id number] Expected date: 08/07/14 Expected time: 11:27 PM Means of arrival: Ambulance Comments: Fall from bed from a SNF

## 2014-08-08 ENCOUNTER — Emergency Department (HOSPITAL_COMMUNITY): Payer: Medicare (Managed Care)

## 2014-08-08 NOTE — ED Notes (Signed)
Delay in transporting pt back to SNF as we were waiting on [facility].  Upon arrival of [facility] we had no idea what [facility] was a resident at.  This Clinical research associatewriter had to contact pt's daughter Loistine SimasJohanna as she is the emergency contact.  Loistine SimasJohanna was irate that SNF did not contact her to let her know that pt had been brought to the ED.

## 2014-08-08 NOTE — ED Provider Notes (Signed)
CSN: [id number]     Arrival date & time 08/07/14  2353 History   First MD Initiated Contact with Patient 08/08/14 0000     Chief Complaint  Patient presents with  . Fall     (Consider location/radiation/quality/duration/timing/severity/associated sxs/prior Treatment) HPI Patient presents from nursing home for fall from bed. EMS states they found patient actually in her bed. Patient states that she fell early this evening and struck the left side of her head and left wrist. She denied loss of consciousness. She complains of mild cervical thoracic back pain. She has no focal weakness or numbness. She is at her baseline mental status. History is limited due to the patient's dementia. Past Medical History  Diagnosis Date  . Hypertension    Past Surgical History  Procedure Laterality Date  . Vascular surgery     History reviewed. No pertinent family history. History  Substance Use Topics  . Smoking status: Current Some Day Smoker  . Smokeless tobacco: Not on file  . Alcohol Use: No   OB History    No data available     Review of Systems  Constitutional: Negative for fever and chills.  Respiratory: Negative for cough and shortness of breath.   Cardiovascular: Negative for chest pain, palpitations and leg swelling.  Gastrointestinal: Negative for nausea, vomiting and abdominal pain.  Musculoskeletal: Positive for back pain and neck pain. Negative for neck stiffness.  Skin: Positive for wound. Negative for rash.  Neurological: Negative for dizziness, syncope, weakness, light-headedness, numbness and headaches.  All other systems reviewed and are negative.     Allergies  Sulfa drugs cross reactors  Home Medications   Prior to Admission medications   Medication Sig Start Date End Date Taking? Authorizing Provider  ARIPiprazole (ABILIFY) 10 MG tablet Take 10 mg by mouth daily.   Yes Historical Provider, MD  cholecalciferol (VITAMIN D) 1000 UNITS tablet Take 1,000 Units by  mouth daily.   Yes Historical Provider, MD  fluocinonide cream (LIDEX) 0.05 % Apply 1 application topically 2 (two) times daily. Apply to sacrum, behind ears, & rash on lower leg twice daily   Yes Historical Provider, MD  FLUoxetine (PROZAC) 20 MG capsule Take 20 mg by mouth daily.   Yes Historical Provider, MD  furosemide (LASIX) 20 MG tablet Take 20 mg by mouth daily.   Yes Historical Provider, MD  HYDROcodone-acetaminophen (NORCO) 7.5-325 MG per tablet Take 1 tablet by mouth 2 (two) times daily.   Yes Historical Provider, MD  ibuprofen (ADVIL,MOTRIN) 400 MG tablet Take 400 mg by mouth 2 (two) times daily.   Yes Historical Provider, MD  lisinopril (PRINIVIL,ZESTRIL) 10 MG tablet Take 10 mg by mouth daily.   Yes Historical Provider, MD  Nutritional Supplements (NUTRITIONAL DRINK PO) Take 237 mLs by mouth daily. Healthy Shake   Yes Historical Provider, MD  senna-docusate (SENOKOT-S) 8.6-50 MG per tablet Take 1 tablet by mouth every other day.   Yes Historical Provider, MD  Skin Protectants, Misc. (EUCERIN) cream Apply 1 application topically 2 (two) times daily. Apply to sacrum, behind ears, & rash on lower leg twice daily   Yes Historical Provider, MD   BP 161/62 mmHg  Pulse 66  Temp(Src) 97.7 F (36.5 C) (Oral)  Resp 14  SpO2 94% Physical Exam  Constitutional: She is oriented to person, place, and time. She appears well-developed and well-nourished. No distress.  HENT:  Head: Normocephalic and atraumatic.  Mouth/Throat: Oropharynx is clear and moist.  No obvious head trauma. No tenderness  with palpation. Midface is stable. No malocclusion.  Eyes: EOM are normal. Pupils are equal, round, and reactive to light.  Neck: Normal range of motion. Neck supple.  Mild diffuse posterior cervical tenderness. No obvious trauma.  Cardiovascular: Normal rate and regular rhythm.  Exam reveals no gallop and no friction rub.   No murmur heard. Pulmonary/Chest: Effort normal and breath sounds normal. No  respiratory distress. She has no wheezes. She has no rales.  Abdominal: Soft. Bowel sounds are normal. She exhibits no distension and no mass. There is no tenderness. There is no rebound and no guarding.  Musculoskeletal: Normal range of motion. She exhibits no edema or tenderness.  Pelvis stable. Normal range of motion in all extremities including bilateral hips. Distal pulses intact. Mild diffuse thoracic midline tenderness to palpation.  Neurological: She is alert and oriented to person, place, and time.  Moves all extremities without deficit. Sensation is grossly intact.  Skin: Skin is warm and dry. No rash noted. No erythema.  Psychiatric: She has a normal mood and affect. Her behavior is normal.  Nursing note and vitals reviewed.   ED Course  Procedures (including critical care time) Labs Review Labs Reviewed - No data to display  Imaging Review Dg Thoracic Spine 2 View  08/08/2014   CLINICAL DATA:  Expand All Collapse All Pt was found in the floor on the side of her bed, only complains of mid back pain. Laceration on radius side of wrist. No known prior injuries or surgeries. To back or wrist.  EXAM: THORACIC SPINE - 2 VIEW  COMPARISON:  Chest x-ray 03/08/2014  FINDINGS: Exam demonstrates mild diffuse decreased bone mineralization. There is subtle biphasic curvature of the thoracolumbar spine unchanged. Pedicles are intact. There is no acute compression fracture or subluxation. Remainder the exam is unchanged.  IMPRESSION: No acute findings.   Electronically Signed   By: Elberta Fortisaniel  Boyle M.D.   On: 08/08/2014 00:51   Dg Wrist Complete Left  08/08/2014   CLINICAL DATA:  Patient was found on floor beside head. Complains of mid back pain. Laceration on the radial side of wrist.  EXAM: LEFT WRIST - COMPLETE 3+ VIEW  COMPARISON:  None.  FINDINGS: Diffuse bone demineralization. Degenerative changes in the radiocarpal, STT, and first carpometacarpal joints. No evidence of acute fracture or  subluxation. No focal bone lesion or bone destruction. Bone cortex and trabecular architecture appear intact. No radiopaque soft tissue foreign bodies.  IMPRESSION: Degenerative changes in the left wrist. No acute bony abnormalities.   Electronically Signed   By: Burman NievesWilliam  Stevens M.D.   On: 08/08/2014 00:51   Ct Head Wo Contrast  08/08/2014   CLINICAL DATA:  Unwitnessed fall. Patient was found on the floor beside her bed. Complains of back pain.  EXAM: CT HEAD WITHOUT CONTRAST  CT CERVICAL SPINE WITHOUT CONTRAST  TECHNIQUE: Multidetector CT imaging of the head and cervical spine was performed following the standard protocol without intravenous contrast. Multiplanar CT image reconstructions of the cervical spine were also generated.  COMPARISON:  03/08/2014  FINDINGS: CT HEAD FINDINGS  Mild diffuse cerebral atrophy. Low-attenuation changes in the deep white matter consistent with small vessel ischemia. No mass effect or midline shift. No abnormal extra-axial fluid collections. Gray-white matter junctions are distinct. Basal cisterns are not effaced. No evidence of acute intracranial hemorrhage. No depressed skull fractures. Visualized paranasal sinuses and mastoid air cells are not opacified.  CT CERVICAL SPINE FINDINGS  Normal alignment of the cervical spine and facet joints. Degenerative  changes in the cervical spine with narrowed cervical interspaces and associated endplate hypertrophic changes. Circumscribed cyst demonstrated in the body of C2 may represent congenital or degenerative cyst. No vertebral compression deformities. No prevertebral soft tissue swelling. Bone cortex and trabecular architecture appear intact although there is diffuse demineralization. Soft tissues are unremarkable. Emphysematous changes suggested in the upper lungs.  IMPRESSION: No acute intracranial abnormalities. Chronic atrophy and small vessel ischemic changes.  Diffuse degenerative change in the cervical spine. Normal alignment.  No displaced fractures.   Electronically Signed   By: Burman Nieves M.D.   On: 08/08/2014 01:02   Ct Cervical Spine Wo Contrast  08/08/2014   CLINICAL DATA:  Unwitnessed fall. Patient was found on the floor beside her bed. Complains of back pain.  EXAM: CT HEAD WITHOUT CONTRAST  CT CERVICAL SPINE WITHOUT CONTRAST  TECHNIQUE: Multidetector CT imaging of the head and cervical spine was performed following the standard protocol without intravenous contrast. Multiplanar CT image reconstructions of the cervical spine were also generated.  COMPARISON:  03/08/2014  FINDINGS: CT HEAD FINDINGS  Mild diffuse cerebral atrophy. Low-attenuation changes in the deep white matter consistent with small vessel ischemia. No mass effect or midline shift. No abnormal extra-axial fluid collections. Gray-white matter junctions are distinct. Basal cisterns are not effaced. No evidence of acute intracranial hemorrhage. No depressed skull fractures. Visualized paranasal sinuses and mastoid air cells are not opacified.  CT CERVICAL SPINE FINDINGS  Normal alignment of the cervical spine and facet joints. Degenerative changes in the cervical spine with narrowed cervical interspaces and associated endplate hypertrophic changes. Circumscribed cyst demonstrated in the body of C2 may represent congenital or degenerative cyst. No vertebral compression deformities. No prevertebral soft tissue swelling. Bone cortex and trabecular architecture appear intact although there is diffuse demineralization. Soft tissues are unremarkable. Emphysematous changes suggested in the upper lungs.  IMPRESSION: No acute intracranial abnormalities. Chronic atrophy and small vessel ischemic changes.  Diffuse degenerative change in the cervical spine. Normal alignment. No displaced fractures.   Electronically Signed   By: Burman Nieves M.D.   On: 08/08/2014 01:02     EKG Interpretation None      MDM   Final diagnoses:  Closed head injury  Fall,  initial encounter  Abrasion of left wrist, initial encounter    X-rays without any acute abnormality. We'll discharge back to nursing home. Head injury precautions given.    Loren Racer, MD 08/08/14 [id number]

## 2014-08-08 NOTE — ED Notes (Signed)
Attempted to call report x 2 to SNF however was unsuccessful.  D/C instructions sent to SNF w/ [facility].

## 2014-08-08 NOTE — Discharge Instructions (Signed)
Abrasion °An abrasion is a cut or scrape of the skin. Abrasions do not extend through all layers of the skin and most heal within 10 days. It is important to care for your abrasion properly to prevent infection. °CAUSES  °Most abrasions are caused by falling on, or gliding across, the ground or other surface. When your skin rubs on something, the outer and inner layer of skin rubs off, causing an abrasion. °DIAGNOSIS  °Your caregiver will be able to diagnose an abrasion during a physical exam.  °TREATMENT  °Your treatment depends on how large and deep the abrasion is. Generally, your abrasion will be cleaned with water and a mild soap to remove any dirt or debris. An antibiotic ointment may be put over the abrasion to prevent an infection. A bandage (dressing) may be wrapped around the abrasion to keep it from getting dirty.  °You may need a tetanus shot if: °· You cannot remember when you had your last tetanus shot. °· You have never had a tetanus shot. °· The injury broke your skin. °If you get a tetanus shot, your arm may swell, get red, and feel warm to the touch. This is common and not a problem. If you need a tetanus shot and you choose not to have one, there is a rare chance of getting tetanus. Sickness from tetanus can be serious.  °HOME CARE INSTRUCTIONS  °· If a dressing was applied, change it at least once a day or as directed by your caregiver. If the bandage sticks, soak it off with warm water.   °· Wash the area with water and a mild soap to remove all the ointment 2 times a day. Rinse off the soap and pat the area dry with a clean towel.   °· Reapply any ointment as directed by your caregiver. This will help prevent infection and keep the bandage from sticking. Use gauze over the wound and under the dressing to help keep the bandage from sticking.   °· Change your dressing right away if it becomes wet or dirty.   °· Only take over-the-counter or prescription medicines for pain, discomfort, or fever as  directed by your caregiver.   °· Follow up with your caregiver within 24-48 hours for a wound check, or as directed. If you were not given a wound-check appointment, look closely at your abrasion for redness, swelling, or pus. These are signs of infection. °SEEK IMMEDIATE MEDICAL CARE IF:  °· You have increasing pain in the wound.   °· You have redness, swelling, or tenderness around the wound.   °· You have pus coming from the wound.   °· You have a fever or persistent symptoms for more than 2-3 days. °· You have a fever and your symptoms suddenly get worse. °· You have a bad smell coming from the wound or dressing.   °MAKE SURE YOU:  °· Understand these instructions. °· Will watch your condition. °· Will get help right away if you are not doing well or get worse. °Document Released: 05/23/2005 Document Revised: 07/30/2012 Document Reviewed: 07/17/2011 °ExitCare® Patient Information ©2015 [employer]. This information is not intended to replace advice given to you by your health care provider. Make sure you discuss any questions you have with your health care provider. ° °Head Injury °You have received a head injury. It does not appear serious at this time. Headaches and vomiting are common following head injury. It should be easy to awaken from sleeping. Sometimes it is necessary for you to stay in the emergency department for   a while for observation. Sometimes admission to the hospital may be needed. After injuries such as yours, most problems occur within the first 24 hours, but side effects may occur up to 7-10 days after the injury. It is important for you to carefully monitor your condition and contact your health care provider or seek immediate medical care if there is a change in your condition. °WHAT ARE THE TYPES OF HEAD INJURIES? °Head injuries can be as minor as a bump. Some head injuries can be more severe. More severe head injuries include: °· A jarring injury to the brain (concussion). °· A bruise  of the brain (contusion). This mean there is bleeding in the brain that can cause swelling. °· A cracked skull (skull fracture). °· Bleeding in the brain that collects, clots, and forms a bump (hematoma). °WHAT CAUSES A HEAD INJURY? °A serious head injury is most likely to happen to someone who is in a car wreck and is not wearing a seat belt. Other causes of major head injuries include bicycle or motorcycle accidents, sports injuries, and falls. °HOW ARE HEAD INJURIES DIAGNOSED? °A complete history of the event leading to the injury and your current symptoms will be helpful in diagnosing head injuries. Many times, pictures of the brain, such as CT or MRI are needed to see the extent of the injury. Often, an overnight hospital stay is necessary for observation.  °WHEN SHOULD I SEEK IMMEDIATE MEDICAL CARE?  °You should get help right away if: °· You have confusion or drowsiness. °· You feel sick to your stomach (nauseous) or have continued, forceful vomiting. °· You have dizziness or unsteadiness that is getting worse. °· You have severe, continued headaches not relieved by medicine. Only take over-the-counter or prescription medicines for pain, fever, or discomfort as directed by your health care provider. °· You do not have normal function of the arms or legs or are unable to walk. °· You notice changes in the black spots in the center of the colored part of your eye (pupil). °· You have a clear or bloody fluid coming from your nose or ears. °· You have a loss of vision. °During the next 24 hours after the injury, you must stay with someone who can watch you for the warning signs. This person should contact local emergency services (911 in the [place] if you have seizures, you become unconscious, or you are unable to wake up. °HOW CAN I PREVENT A HEAD INJURY IN THE FUTURE? °The most important factor for preventing major head injuries is avoiding motor vehicle accidents.  To minimize the potential for damage to your  head, it is crucial to wear seat belts while riding in motor vehicles. Wearing helmets while bike riding and playing collision sports (like football) is also helpful. Also, avoiding dangerous activities around the house will further help reduce your risk of head injury.  °WHEN CAN I RETURN TO NORMAL ACTIVITIES AND ATHLETICS? °You should be reevaluated by your health care provider before returning to these activities. If you have any of the following symptoms, you should not return to activities or contact sports until 1 week after the symptoms have stopped: °· Persistent headache. °· Dizziness or vertigo. °· Poor attention and concentration. °· Confusion. °· Memory problems. °· Nausea or vomiting. °· Fatigue or tire easily. °· Irritability. °· Intolerant of bright lights or loud noises. °· Anxiety or depression. °· Disturbed sleep. °MAKE SURE YOU:  °· Understand these instructions. °· Will watch your condition. °· Will get   help right away if you are not doing well or get worse. °Document Released: 08/13/2005 Document Revised: 08/18/2013 Document Reviewed: 04/20/2013 °ExitCare® Patient Information ©2015 [employer]. This information is not intended to replace advice given to you by your health care provider. Make sure you discuss any questions you have with your health care provider. ° °

## 2015-03-29 ENCOUNTER — Emergency Department (HOSPITAL_COMMUNITY): Payer: Medicare (Managed Care)

## 2015-03-29 ENCOUNTER — Emergency Department (HOSPITAL_COMMUNITY)
Admission: EM | Admit: 2015-03-29 | Discharge: 2015-03-29 | Disposition: A | Discharge status: Home / Self Care | Payer: Medicare (Managed Care) | Attending: Physician Assistant | Admitting: Physician Assistant

## 2015-03-29 DIAGNOSIS — Z791 Long term (current) use of non-steroidal anti-inflammatories (NSAID): Secondary | ICD-10-CM | POA: Diagnosis not present

## 2015-03-29 DIAGNOSIS — Z720 Tobacco use: Secondary | ICD-10-CM | POA: Insufficient documentation

## 2015-03-29 DIAGNOSIS — Y999 Unspecified external cause status: Secondary | ICD-10-CM | POA: Insufficient documentation

## 2015-03-29 DIAGNOSIS — S0081XA Abrasion of other part of head, initial encounter: Secondary | ICD-10-CM | POA: Diagnosis not present

## 2015-03-29 DIAGNOSIS — I10 Essential (primary) hypertension: Secondary | ICD-10-CM | POA: Diagnosis not present

## 2015-03-29 DIAGNOSIS — F0390 Unspecified dementia without behavioral disturbance: Secondary | ICD-10-CM | POA: Diagnosis not present

## 2015-03-29 DIAGNOSIS — W19XXXA Unspecified fall, initial encounter: Secondary | ICD-10-CM

## 2015-03-29 DIAGNOSIS — S3992XA Unspecified injury of lower back, initial encounter: Secondary | ICD-10-CM | POA: Diagnosis present

## 2015-03-29 DIAGNOSIS — Y92129 Unspecified place in nursing home as the place of occurrence of the external cause: Secondary | ICD-10-CM | POA: Insufficient documentation

## 2015-03-29 DIAGNOSIS — Y939 Activity, unspecified: Secondary | ICD-10-CM | POA: Diagnosis not present

## 2015-03-29 DIAGNOSIS — Z79899 Other long term (current) drug therapy: Secondary | ICD-10-CM | POA: Insufficient documentation

## 2015-03-29 DIAGNOSIS — W1839XA Other fall on same level, initial encounter: Secondary | ICD-10-CM | POA: Insufficient documentation

## 2015-03-29 LAB — BASIC METABOLIC PANEL
ANION GAP: 7 (ref 5–15)
BUN: 24 mg/dL — ABNORMAL HIGH (ref 6–20)
CO2: 28 mmol/L (ref 22–32)
Calcium: 8.9 mg/dL (ref 8.9–10.3)
Chloride: 106 mmol/L (ref 101–111)
Creatinine, Ser: 0.71 mg/dL (ref 0.44–1.00)
GFR calc non Af Amer: >60 mL/min (ref >60)
Glucose, Bld: 91 mg/dL (ref 65–99)
Potassium: 3.8 mmol/L (ref 3.5–5.1)
Sodium: 141 mmol/L (ref 135–145)

## 2015-03-29 LAB — URINALYSIS, ROUTINE W REFLEX MICROSCOPIC
BILIRUBIN URINE: NEGATIVE
GLUCOSE, UA: NEGATIVE mg/dL
Hgb urine dipstick: NEGATIVE
Ketones, ur: NEGATIVE mg/dL
Nitrite: NEGATIVE
PROTEIN: NEGATIVE mg/dL
Specific Gravity, Urine: 1.02 (ref 1.005–1.030)
Urobilinogen, UA: 0.2 mg/dL (ref 0.0–1.0)
pH: 7 (ref 5.0–8.0)

## 2015-03-29 LAB — CBC WITH DIFFERENTIAL/PLATELET
Basophils Absolute: 0 10*3/uL (ref 0.0–0.1)
Basophils Relative: 0 % (ref 0–1)
EOS ABS: 0.1 10*3/uL (ref 0.0–0.7)
Eosinophils Relative: 1 % (ref 0–5)
HEMATOCRIT: 30.1 % — AB (ref 36.0–46.0)
Hemoglobin: 8.8 g/dL — ABNORMAL LOW (ref 12.0–15.0)
Lymphocytes Relative: 14 % (ref 12–46)
Lymphs Abs: 1.1 10*3/uL (ref 0.7–4.0)
MCH: 24.8 pg — AB (ref 26.0–34.0)
MCHC: 29.2 g/dL — ABNORMAL LOW (ref 30.0–36.0)
MCV: 84.8 fL (ref 78.0–100.0)
MONO ABS: 0.5 10*3/uL (ref 0.1–1.0)
MONOS PCT: 7 % (ref 3–12)
NEUTROS ABS: 6.1 10*3/uL (ref 1.7–7.7)
Neutrophils Relative %: 78 % — ABNORMAL HIGH (ref 43–77)
PLATELETS: 306 10*3/uL (ref 150–400)
RBC: 3.55 MIL/uL — ABNORMAL LOW (ref 3.87–5.11)
RDW: 15.7 % — ABNORMAL HIGH (ref 11.5–15.5)
WBC: 7.9 10*3/uL (ref 4.0–10.5)

## 2015-03-29 LAB — URINE MICROSCOPIC-ADD ON

## 2015-03-29 LAB — TROPONIN I: Troponin I: <0.03 ng/mL (ref <0.031)

## 2015-03-29 LAB — CK: CK TOTAL: 104 U/L (ref 38–234)

## 2015-03-29 NOTE — ED Notes (Signed)
Per daughter: pt had been found face down this am in SNF.  She had been c/o dizziness for the last few weeks.  Does walk with a walker but daughter states it's been worse in the last few weeks.

## 2015-03-29 NOTE — ED Notes (Signed)
Bed: ZO10 Expected date:  Expected time:  Means of arrival:  Comments: 19F unwitnessed fall EMS

## 2015-03-29 NOTE — ED Notes (Signed)
Pt from [facility] SNF alzheimers unit.  Was found by staff on the floor this am.  Pt has alzheimers and unoriented and is at her baseline.  Unsure of how long she was down on floor.  C/O low back pain, headache and has small abrasion above LT eye.  C-collar and KED placed by EMS.  Not on blood thinners.  VS by EMS: 186/86, 69 reg, 92%RA / 99% 2L/[place], CBG 104.

## 2015-03-29 NOTE — ED Provider Notes (Signed)
CSN: [id number]     Arrival date & time 03/29/15  1478 History   First MD Initiated Contact with Patient 03/29/15 [phone]     Chief Complaint  Patient presents with  . Fall  . Back Pain     (Consider location/radiation/quality/duration/timing/severity/associated sxs/prior Treatment) HPI  Patient is an 81 year old demented female presenting today after fall. Patient has multiple falls due to her dementia. She also sleeps with over 10 doll's which daughter reports causes her to fall more often. Patietn  is with her daughter on arrival here. Reportedly patient was at [facility] nursing home and found on the floor by staff. Staff did not want to call the EMS but on daughter's insistence they did. Patient is followed by [facility].   Past Medical History  Diagnosis Date  . Hypertension    Past Surgical History  Procedure Laterality Date  . Vascular surgery     No family history on file. History  Substance Use Topics  . Smoking status: Current Some Day Smoker  . Smokeless tobacco: Not on file  . Alcohol Use: No   OB History    No data available     Review of Systems  Unable to perform ROS: Dementia      Allergies  Sulfa drugs cross reactors  Home Medications   Prior to Admission medications   Medication Sig Start Date End Date Taking? Authorizing Provider  ARIPiprazole (ABILIFY) 10 MG tablet Take 10 mg by mouth daily.   Yes Historical Provider, MD  cholecalciferol (VITAMIN D) 1000 UNITS tablet Take 1,000 Units by mouth daily.   Yes Historical Provider, MD  fluocinonide cream (LIDEX) 0.05 % Apply 1 application topically 2 (two) times daily. Apply to sacrum, behind ears, & rash on lower leg twice daily   Yes Historical Provider, MD  FLUoxetine (PROZAC) 20 MG capsule Take 20 mg by mouth daily.   Yes Historical Provider, MD  furosemide (LASIX) 20 MG tablet Take 20 mg by mouth daily.   Yes Historical Provider, MD  HYDROcodone-acetaminophen (NORCO/VICODIN) 5-325 MG per tablet Take 1  tablet by mouth 2 (two) times daily.   Yes Historical Provider, MD  ibuprofen (ADVIL,MOTRIN) 400 MG tablet Take 400 mg by mouth 2 (two) times daily.   Yes Historical Provider, MD  lisinopril (PRINIVIL,ZESTRIL) 10 MG tablet Take 10 mg by mouth daily.   Yes Historical Provider, MD  senna-docusate (SENOKOT-S) 8.6-50 MG per tablet Take 1 tablet by mouth every other day.   Yes Historical Provider, MD  Skin Protectants, Misc. (EUCERIN) cream Apply 1 application topically 2 (two) times daily. Apply to sacrum, behind ears, & rash on lower leg twice daily   Yes Historical Provider, MD   BP 179/61 mmHg  Pulse 72  Temp(Src) 98.6 F (37 C) (Oral)  Resp 18  SpO2 93% Physical Exam  Constitutional: She is oriented to person, place, and time. She appears well-developed and well-nourished.  HENT:  Head: Normocephalic and atraumatic.  Small abrasion to forehead.  Eyes: Conjunctivae are normal. Right eye exhibits no discharge.  Neck: Neck supple.  Cardiovascular: Normal rate, regular rhythm and normal heart sounds.   No murmur heard. Pulmonary/Chest: Effort normal and breath sounds normal. She has no wheezes. She has no rales.  Abdominal: Soft. She exhibits no distension. There is no tenderness.  Musculoskeletal: Normal range of motion. She exhibits no edema.  Excellent range of motion of bilateral hips. Mild tenderness on palpation of the left hip.  C, T and L-spine tenderness diffusely.  Neurological:  She is oriented to person, place, and time. No cranial nerve deficit.  Skin: Skin is warm and dry. No rash noted. She is not diaphoretic.  No other ecchymosis or abrasions noted.  Psychiatric: She has a normal mood and affect. Her behavior is normal.  Nursing note and vitals reviewed.   ED Course  Procedures (including critical care time) Labs Review Labs Reviewed  BASIC METABOLIC PANEL - Abnormal; Notable for the following:    BUN 24 (*)    All other components within normal limits  CBC WITH  DIFFERENTIAL/PLATELET - Abnormal; Notable for the following:    RBC 3.55 (*)    Hemoglobin 8.8 (*)    HCT 30.1 (*)    MCH 24.8 (*)    MCHC 29.2 (*)    RDW 15.7 (*)    Neutrophils Relative % 78 (*)    All other components within normal limits  URINALYSIS, ROUTINE W REFLEX MICROSCOPIC (NOT AT [facility]) - Abnormal; Notable for the following:    Leukocytes, UA TRACE (*)    All other components within normal limits  URINE MICROSCOPIC-ADD ON - Abnormal; Notable for the following:    Bacteria, UA FEW (*)    All other components within normal limits  TROPONIN I  CK    Imaging Review Dg Thoracic Spine 2 View  03/29/2015   CLINICAL DATA:  Fall, history of dementia  EXAM: THORACIC SPINE 2 VIEWS  COMPARISON:  08/26/2014  FINDINGS: Three views of thoracic spine submitted. No acute fracture or subluxation. There is diffuse osteopenia. Calcified left hilar lymph node again noted. Stable degenerative changes with mild anterior spurring lower thoracic spine.  IMPRESSION: No acute fracture or subluxation. Diffuse osteopenia. Stable mild degenerative changes.   Electronically Signed   By: Natasha Mead M.D.   On: 03/29/2015 09:52   Dg Lumbar Spine Complete  03/29/2015   CLINICAL DATA:  Fall, history of dementia  EXAM: LUMBAR SPINE - COMPLETE 4+ VIEW  COMPARISON:  03/08/2014  FINDINGS: Five views of lumbar spine submitted. No acute fracture or subluxation. Dextroscoliosis of lumbar spine again noted. Again noted left lateral osteophytes at L2-L3 level. Again noted disc space flattening with endplate sclerotic changes and anterior spurring at L2-L3 level. Mild disc space flattening at L3-L4 level. Disc space flattening with vacuum disc phenomenon at L5-S1 level. Probable calcified gallbladder wall again noted in right upper quadrant. Facet degenerative changes L5 level. There is diffuse osteopenia.  IMPRESSION: No acute fracture or subluxation. Diffuse osteopenia. Dextroscoliosis. Degenerative changes as described above.    Electronically Signed   By: Natasha Mead M.D.   On: 03/29/2015 09:55   Dg Pelvis 1-2 Views  03/29/2015   CLINICAL DATA:  Unwitnessed fall today. Found down on the floor. Unable to communicate pain. Dementia.  EXAM: PELVIS - 1-2 VIEW  COMPARISON:  Lumbar spine radiographs 03/08/2014  FINDINGS: Mild osteopenia is present. The pelvis is intact. No acute fractures are present. Moderate degenerative changes are present in the lower lumbar spine, particularly on the right at L5-S1.  IMPRESSION: 1. No acute fracture. 2. Mild osteopenia. 3. Degenerative changes in the lower lumbar spine.   Electronically Signed   By: Marin Roberts M.D.   On: 03/29/2015 09:56   Ct Head Wo Contrast  03/29/2015   CLINICAL DATA:  81 year old female with hypertension and dementia post fall. Found on floor. Redness forehead. Initial encounter.  EXAM: CT HEAD WITHOUT CONTRAST  CT CERVICAL SPINE WITHOUT CONTRAST  TECHNIQUE: Multidetector CT imaging of the  head and cervical spine was performed following the standard protocol without intravenous contrast. Multiplanar CT image reconstructions of the cervical spine were also generated.  COMPARISON:  08/08/2014  FINDINGS: CT HEAD FINDINGS  No skull fracture or intracranial hemorrhage.  Prominent small vessel disease type changes without CT evidence of large acute infarct.  Global atrophy without hydrocephalus.  No intracranial mass lesion noted on this unenhanced exam.  Vascular calcifications.  CT CERVICAL SPINE FINDINGS  No cervical spine fracture or malalignment.  Nonspecific lucencies most notable C2 unchanged.  Remodeling of bone from ectatic vertebral artery C5 level.  Cervical spondylotic changes with various degrees of spinal stenosis and foraminal narrowing.  Vascular calcifications.  IMPRESSION: CT HEAD  No skull fracture or intracranial hemorrhage.  Prominent small vessel disease type changes without CT evidence of large acute infarct.  CT CERVICAL SPINE  No cervical spine fracture  or malalignment.  Nonspecific lucencies most notable C2 unchanged.   Electronically Signed   By: Lacy Duverney M.D.   On: 03/29/2015 10:13   Ct Cervical Spine Wo Contrast  03/29/2015   CLINICAL DATA:  81 year old female with hypertension and dementia post fall. Found on floor. Redness forehead. Initial encounter.  EXAM: CT HEAD WITHOUT CONTRAST  CT CERVICAL SPINE WITHOUT CONTRAST  TECHNIQUE: Multidetector CT imaging of the head and cervical spine was performed following the standard protocol without intravenous contrast. Multiplanar CT image reconstructions of the cervical spine were also generated.  COMPARISON:  08/08/2014  FINDINGS: CT HEAD FINDINGS  No skull fracture or intracranial hemorrhage.  Prominent small vessel disease type changes without CT evidence of large acute infarct.  Global atrophy without hydrocephalus.  No intracranial mass lesion noted on this unenhanced exam.  Vascular calcifications.  CT CERVICAL SPINE FINDINGS  No cervical spine fracture or malalignment.  Nonspecific lucencies most notable C2 unchanged.  Remodeling of bone from ectatic vertebral artery C5 level.  Cervical spondylotic changes with various degrees of spinal stenosis and foraminal narrowing.  Vascular calcifications.  IMPRESSION: CT HEAD  No skull fracture or intracranial hemorrhage.  Prominent small vessel disease type changes without CT evidence of large acute infarct.  CT CERVICAL SPINE  No cervical spine fracture or malalignment.  Nonspecific lucencies most notable C2 unchanged.   Electronically Signed   By: Lacy Duverney M.D.   On: 03/29/2015 10:13     EKG Interpretation   Date/Time:  Tuesday March 29 2015 09:11:09 EDT Ventricular Rate:  70 PR Interval:  175 QRS Duration: 139 QT Interval:  435 QTC Calculation: 469 R Axis:   -72 Text Interpretation:  Sinus rhythm RBBB and LAFB Baseline wander in  lead(s) V4 no acute ischemia. abnormal ekg Confirmed by Kandis Mannan  [phone]) on 03/29/2015 9:14:02 AM       MDM   Final diagnoses:  Fall, initial encounter   patient is a 81 year old female found down at her nursing home. Patient has abrasion to her forehead and tenderness on her C  T and L-spine. We'll get corresponding imaging. Additionally will get chest x-ray and x-ray of the hips given the tenderness to her left hip. However anticipate that imaging will come back normal. We'll screen for UTI other electrolyte abnormality that could've caused her fall.  Conversation had with daughter about the level of dementia and comfort and returning back home to [place] rather than admission to the hospital.    Reviewed the images myself and agree with radiologist's impression.    No acute fractures or infection, will discharge back  to SNF.  Nataliya Graig Randall An, MD 03/30/15 [phone]

## 2015-03-29 NOTE — Discharge Instructions (Signed)

## 2015-10-06 ENCOUNTER — Other Ambulatory Visit: Payer: Self-pay | Admitting: Family Medicine

## 2015-10-06 DIAGNOSIS — N6310 Unspecified lump in the right breast, unspecified quadrant: Principal | ICD-10-CM

## 2015-10-06 DIAGNOSIS — N6315 Unspecified lump in the right breast, overlapping quadrants: Secondary | ICD-10-CM

## 2015-10-12 ENCOUNTER — Ambulatory Visit
Admission: RE | Admit: 2015-10-12 | Discharge: 2015-10-12 | Disposition: A | Discharge status: Home / Self Care | Payer: Medicare Other | Source: Ambulatory Visit | Attending: Family Medicine | Admitting: Family Medicine

## 2015-10-12 ENCOUNTER — Other Ambulatory Visit: Payer: Self-pay | Admitting: Family Medicine

## 2015-10-12 DIAGNOSIS — N6315 Unspecified lump in the right breast, overlapping quadrants: Secondary | ICD-10-CM

## 2015-10-12 DIAGNOSIS — N6310 Unspecified lump in the right breast, unspecified quadrant: Principal | ICD-10-CM

## 2015-10-12 DIAGNOSIS — R51 Headache: Principal | ICD-10-CM

## 2015-10-12 DIAGNOSIS — R519 Headache, unspecified: Secondary | ICD-10-CM

## 2015-12-27 ENCOUNTER — Emergency Department (HOSPITAL_COMMUNITY): Payer: Medicare (Managed Care)

## 2015-12-27 ENCOUNTER — Encounter (HOSPITAL_COMMUNITY): Payer: Self-pay | Admitting: Emergency Medicine

## 2015-12-27 ENCOUNTER — Emergency Department (HOSPITAL_COMMUNITY)
Admission: EM | Admit: 2015-12-27 | Discharge: 2015-12-27 | Disposition: A | Discharge status: Home / Self Care | Payer: Medicare (Managed Care) | Attending: Emergency Medicine | Admitting: Emergency Medicine

## 2015-12-27 DIAGNOSIS — M25561 Pain in right knee: Secondary | ICD-10-CM | POA: Diagnosis present

## 2015-12-27 DIAGNOSIS — W19XXXA Unspecified fall, initial encounter: Secondary | ICD-10-CM | POA: Diagnosis not present

## 2015-12-27 DIAGNOSIS — Y939 Activity, unspecified: Secondary | ICD-10-CM | POA: Insufficient documentation

## 2015-12-27 DIAGNOSIS — Z791 Long term (current) use of non-steroidal anti-inflammatories (NSAID): Secondary | ICD-10-CM | POA: Diagnosis not present

## 2015-12-27 DIAGNOSIS — F0390 Unspecified dementia without behavioral disturbance: Secondary | ICD-10-CM | POA: Insufficient documentation

## 2015-12-27 DIAGNOSIS — Z79891 Long term (current) use of opiate analgesic: Secondary | ICD-10-CM | POA: Diagnosis not present

## 2015-12-27 DIAGNOSIS — S0083XA Contusion of other part of head, initial encounter: Secondary | ICD-10-CM

## 2015-12-27 DIAGNOSIS — I10 Essential (primary) hypertension: Secondary | ICD-10-CM | POA: Diagnosis not present

## 2015-12-27 DIAGNOSIS — Y929 Unspecified place or not applicable: Secondary | ICD-10-CM | POA: Diagnosis not present

## 2015-12-27 DIAGNOSIS — Z79899 Other long term (current) drug therapy: Secondary | ICD-10-CM | POA: Insufficient documentation

## 2015-12-27 DIAGNOSIS — F17200 Nicotine dependence, unspecified, uncomplicated: Secondary | ICD-10-CM | POA: Diagnosis not present

## 2015-12-27 DIAGNOSIS — Y999 Unspecified external cause status: Secondary | ICD-10-CM | POA: Insufficient documentation

## 2015-12-27 LAB — I-STAT CHEM 8, ED
BUN: 22 mg/dL — ABNORMAL HIGH (ref 6–20)
CALCIUM ION: 1.14 mmol/L (ref 1.13–1.30)
CHLORIDE: 103 mmol/L (ref 101–111)
Creatinine, Ser: 0.8 mg/dL (ref 0.44–1.00)
Glucose, Bld: 96 mg/dL (ref 65–99)
HCT: 44 % (ref 36.0–46.0)
HEMOGLOBIN: 15 g/dL (ref 12.0–15.0)
Potassium: 4.1 mmol/L (ref 3.5–5.1)
SODIUM: 142 mmol/L (ref 135–145)
TCO2: 30 mmol/L (ref 0–100)

## 2015-12-27 LAB — URINALYSIS, ROUTINE W REFLEX MICROSCOPIC
Bilirubin Urine: NEGATIVE
GLUCOSE, UA: NEGATIVE mg/dL
HGB URINE DIPSTICK: NEGATIVE
Ketones, ur: NEGATIVE mg/dL
Nitrite: NEGATIVE
PROTEIN: NEGATIVE mg/dL
Specific Gravity, Urine: 1.009 (ref 1.005–1.030)
pH: 7.5 (ref 5.0–8.0)

## 2015-12-27 LAB — URINE MICROSCOPIC-ADD ON

## 2015-12-27 NOTE — ED Notes (Signed)
Per [facility] pt from [facility] facility, had fall today, pt unable to recall events, Hx dementia. Staff walked to room finding pt on her knees, co right knee pain and abrasion. Also presents with right forehead hematoma. Unknown loc. Alert and oriented to self only which is not normal to baseline , per staff pt was able to recall events in the past.

## 2015-12-27 NOTE — Discharge Instructions (Signed)
Contusion °A contusion is a deep bruise. Contusions are the result of a blunt injury to tissues and muscle fibers under the skin. The injury causes bleeding under the skin. The skin overlying the contusion may turn blue, purple, or yellow. Minor injuries will give you a painless contusion, but more severe contusions may stay painful and swollen for a few weeks.  °CAUSES  °This condition is usually caused by a blow, trauma, or direct force to an area of the body. °SYMPTOMS  °Symptoms of this condition include: °· Swelling of the injured area. °· Pain and tenderness in the injured area. °· Discoloration. The area may have redness and then turn blue, purple, or yellow. °DIAGNOSIS  °This condition is diagnosed based on a physical exam and medical history. An X-ray, CT scan, or MRI may be needed to determine if there are any associated injuries, such as broken bones (fractures). °TREATMENT  °Specific treatment for this condition depends on what area of the body was injured. In general, the best treatment for a contusion is resting, icing, applying pressure to (compression), and elevating the injured area. This is often called the RICE strategy. Over-the-counter anti-inflammatory medicines may also be recommended for pain control.  °HOME CARE INSTRUCTIONS  °· Rest the injured area. °· If directed, apply ice to the injured area: °¨ Put ice in a plastic bag. °¨ Place a towel between your skin and the bag. °¨ Leave the ice on for 20 minutes, 2-3 times per day. °· If directed, apply light compression to the injured area using an elastic bandage. Make sure the bandage is not wrapped too tightly. Remove and reapply the bandage as directed by your health care provider. °· If possible, raise (elevate) the injured area above the level of your heart while you are sitting or lying down. °· Take over-the-counter and prescription medicines only as told by your health care provider. °SEEK MEDICAL CARE IF: °· Your symptoms do not  improve after several days of treatment. °· Your symptoms get worse. °· You have difficulty moving the injured area. °SEEK IMMEDIATE MEDICAL CARE IF:  °· You have severe pain. °· You have numbness in a hand or foot. °· Your hand or foot turns pale or cold. °  °This information is not intended to replace advice given to you by your health care provider. Make sure you discuss any questions you have with your health care provider. °  °Document Released: 05/23/2005 Document Revised: 05/04/2015 Document Reviewed: 12/29/2014 °Elsevier Interactive Patient Education ©2016 [employer] ° °Facial or Scalp Contusion °A facial or scalp contusion is a deep bruise on the face or head. Injuries to the face and head generally cause a lot of swelling, especially around the eyes. Contusions are the result of an injury that caused bleeding under the skin. The contusion may turn blue, purple, or yellow. Minor injuries will give you a painless contusion, but more severe contusions may stay painful and swollen for a few weeks.  °CAUSES  °A facial or scalp contusion is caused by a blunt injury or trauma to the face or head area.  °SIGNS AND SYMPTOMS  °· Swelling of the injured area.   °· Discoloration of the injured area.   °· Tenderness, soreness, or pain in the injured area.   °DIAGNOSIS  °The diagnosis can be made by taking a medical history and doing a physical exam. An X-ray exam, CT scan, or MRI may be needed to determine if there are any associated injuries, such as broken bones (fractures). °  TREATMENT  °Often, the best treatment for a facial or scalp contusion is applying cold compresses to the injured area. Over-the-counter medicines may also be recommended for pain control.  °HOME CARE INSTRUCTIONS  °· Only take over-the-counter or prescription medicines as directed by your health care provider.   °· Apply ice to the injured area.   °¨ Put ice in a plastic bag.   °¨ Place a towel between your skin and the bag.   °¨ Leave the  ice on for 20 minutes, 2-3 times a day.   °SEEK MEDICAL CARE IF: °· You have bite problems.   °· You have pain with chewing.   °· You are concerned about facial defects. °SEEK IMMEDIATE MEDICAL CARE IF: °· You have severe pain or a headache that is not relieved by medicine.   °· You have unusual sleepiness, confusion, or personality changes.   °· You throw up (vomit).   °· You have a persistent nosebleed.   °· You have double vision or blurred vision.   °· You have fluid drainage from your nose or ear.   °· You have difficulty walking or using your arms or legs.   °MAKE SURE YOU:  °· Understand these instructions. °· Will watch your condition. °· Will get help right away if you are not doing well or get worse. °  °This information is not intended to replace advice given to you by your health care provider. Make sure you discuss any questions you have with your health care provider. °  °Document Released: 09/20/2004 Document Revised: 09/03/2014 Document Reviewed: 03/26/2013 °Elsevier Interactive Patient Education ©2016 [employer]. ° °

## 2015-12-27 NOTE — ED Notes (Signed)
Bed: [id number] Expected date:  Expected time:  Means of arrival:  Comments: Ems-84 fall

## 2015-12-27 NOTE — ED Provider Notes (Signed)
CSN: [id number]     Arrival date & time 12/27/15  [id number] History   First MD Initiated Contact with Patient 12/27/15 [phone]     Chief Complaint  Patient presents with  . Fall     (Consider location/radiation/quality/duration/timing/severity/associated sxs/prior Treatment) HPI Comments: Pt here [facility] Pt has h/o dementia and found in her room with a left forehead hematoma and c/o right knee pain Pt couldn't tell me how the fall happened today Ems called and pt placed in a c-collar and transported here  Patient is a 82 y.o. female presenting with fall. The history is provided by the patient, the EMS personnel and medical records. The history is limited by the condition of the patient.  Fall    Past Medical History  Diagnosis Date  . Hypertension    Past Surgical History  Procedure Laterality Date  . Vascular surgery     No family history on file. Social History  Substance Use Topics  . Smoking status: Current Some Day Smoker  . Smokeless tobacco: None  . Alcohol Use: No   OB History    No data available     Review of Systems  Unable to perform ROS: Dementia      Allergies  Sulfa drugs cross reactors  Home Medications   Prior to Admission medications   Medication Sig Start Date End Date Taking? Authorizing Provider  ARIPiprazole (ABILIFY) 10 MG tablet Take 10 mg by mouth daily.    Historical Provider, MD  cholecalciferol (VITAMIN D) 1000 UNITS tablet Take 1,000 Units by mouth daily.    Historical Provider, MD  fluocinonide cream (LIDEX) 0.05 % Apply 1 application topically 2 (two) times daily. Apply to sacrum, behind ears, & rash on lower leg twice daily    Historical Provider, MD  FLUoxetine (PROZAC) 20 MG capsule Take 20 mg by mouth daily.    Historical Provider, MD  furosemide (LASIX) 20 MG tablet Take 20 mg by mouth daily.    Historical Provider, MD  HYDROcodone-acetaminophen (NORCO/VICODIN) 5-325 MG per tablet Take 1 tablet by mouth 2  (two) times daily.    Historical Provider, MD  ibuprofen (ADVIL,MOTRIN) 400 MG tablet Take 400 mg by mouth 2 (two) times daily.    Historical Provider, MD  lisinopril (PRINIVIL,ZESTRIL) 10 MG tablet Take 10 mg by mouth daily.    Historical Provider, MD  senna-docusate (SENOKOT-S) 8.6-50 MG per tablet Take 1 tablet by mouth every other day.    Historical Provider, MD  Skin Protectants, Misc. (EUCERIN) cream Apply 1 application topically 2 (two) times daily. Apply to sacrum, behind ears, & rash on lower leg twice daily    Historical Provider, MD   BP 175/71 mmHg  Pulse 71  Temp(Src) 98.2 F (36.8 C) (Oral)  Resp 16  SpO2 92% Physical Exam  Constitutional: She appears well-developed and well-nourished.  Non-toxic appearance. No distress.  HENT:  Head: Normocephalic and atraumatic.    Eyes: Conjunctivae, EOM and lids are normal. Pupils are equal, round, and reactive to light.  Neck: Normal range of motion. Neck supple. No tracheal deviation present. No thyroid mass present.    Cardiovascular: Normal rate, regular rhythm and normal heart sounds.  Exam reveals no gallop.   No murmur heard. Pulmonary/Chest: Effort normal and breath sounds normal. No stridor. No respiratory distress. She has no decreased breath sounds. She has no wheezes. She has no rhonchi. She has no rales.  Abdominal: Soft. Normal appearance and bowel sounds are normal.  She exhibits no distension. There is no tenderness. There is no rebound and no CVA tenderness.  Musculoskeletal: Normal range of motion. She exhibits no edema or tenderness.       Legs: Right knee: full range of motion, no deformity  Neurological: She is alert. She has normal strength. No cranial nerve deficit or sensory deficit. GCS eye subscore is 4. GCS verbal subscore is 4. GCS motor subscore is 6.  Skin: Skin is warm and dry. No abrasion and no rash noted.  Psychiatric: She has a normal mood and affect. Her speech is normal and behavior is normal.    Nursing note and vitals reviewed.   ED Course  Procedures (including critical care time) Labs Review Labs Reviewed  URINE CULTURE  URINALYSIS, ROUTINE W REFLEX MICROSCOPIC (NOT AT [facility])  I-STAT CHEM 8, ED    Imaging Review No results found. I have personally reviewed and evaluated these images and lab results as part of my medical decision-making.   EKG Interpretation None      MDM   Final diagnoses:  None    Patient without evidence of injuries on imaging. No evidence of urinary tract infection. Does have a contusion to her forehead. Stable for discharge back to nursing facility    Lorre Nick, MD 12/27/15 [phone]

## 2015-12-29 LAB — URINE CULTURE

## 2015-12-30 ENCOUNTER — Telehealth: Payer: Self-pay

## 2015-12-30 NOTE — Progress Notes (Signed)
ED Antimicrobial Stewardship Positive Culture Follow Up   Kristy Cruz is an 82 y.o. female who [facility] with a chief complaint of  Chief Complaint  Patient presents with  . Fall    Recent Results (from the past 720 hour(s))  Urine culture     Status: Abnormal   Collection Time: 12/27/15  9:54 AM  Result Value Ref Range Status   Specimen Description URINE, CATHETERIZED  Final   Special Requests NONE  Final   Culture >=100,000 COLONIES/mL ESCHERICHIA COLI (A)  Final   Report Status 12/29/2015 FINAL  Final   Organism ID, Bacteria ESCHERICHIA COLI (A)  Final      Susceptibility   Escherichia coli - MIC*    AMPICILLIN <=2 SENSITIVE Sensitive     CEFAZOLIN <=4 SENSITIVE Sensitive     CEFTRIAXONE <=1 SENSITIVE Sensitive     CIPROFLOXACIN <=0.25 SENSITIVE Sensitive     GENTAMICIN <=1 SENSITIVE Sensitive     IMIPENEM <=0.25 SENSITIVE Sensitive     NITROFURANTOIN <=16 SENSITIVE Sensitive     TRIMETH/SULFA <=20 SENSITIVE Sensitive     AMPICILLIN/SULBACTAM <=2 SENSITIVE Sensitive     PIP/TAZO <=4 SENSITIVE Sensitive     * >=100,000 COLONIES/mL ESCHERICHIA COLI    No urinary symptoms and UA negative. No treatment indicated.  ED Provider: Melburn HakeNicole Nadeau, PA-C  Ashlay Altieri L. Roseanne RenoStewart, PharmD PGY2 Infectious Diseases Pharmacy Resident Pager: [phone] 12/30/2015 10:43 AM

## 2015-12-30 NOTE — Telephone Encounter (Signed)
Post ED Visit - Positive Culture Follow-up  Culture report reviewed by antimicrobial stewardship pharmacist:  []  Kristy Cruz, Pharm.D. []  Kristy Cruz, Pharm.D., BCPS []  Kristy Cruz, Pharm.D. []  Kristy Cruz, Pharm.D., BCPS []  Sansom ParkMinh Cruz, [address] BoulevardPharm.D., BCPS, AAHIVP []  Kristy Cruz, Pharm.D., BCPS, AAHIVP [x]  Kristy Cruz, Pharm.D. []  Kristy Cruz, Pharm.D.  Positive urine culture no further patient follow-up is required at this time.  Kristy Cruz, Kristy Cruz 12/30/2015, 10:51 AM

## 2016-01-16 ENCOUNTER — Ambulatory Visit
Admission: RE | Admit: 2016-01-16 | Discharge: 2016-01-16 | Disposition: A | Discharge status: Home / Self Care | Payer: No Typology Code available for payment source | Source: Ambulatory Visit | Attending: Internal Medicine | Admitting: Internal Medicine

## 2016-01-16 ENCOUNTER — Other Ambulatory Visit: Payer: Self-pay | Admitting: Internal Medicine

## 2016-01-16 DIAGNOSIS — R52 Pain, unspecified: Secondary | ICD-10-CM

## 2017-06-04 ENCOUNTER — Other Ambulatory Visit: Payer: Self-pay | Admitting: Nurse Practitioner

## 2017-06-04 ENCOUNTER — Ambulatory Visit
Admission: RE | Admit: 2017-06-04 | Discharge: 2017-06-04 | Disposition: A | Discharge status: Home / Self Care | Payer: Medicare (Managed Care) | Source: Ambulatory Visit | Attending: Nurse Practitioner | Admitting: Nurse Practitioner

## 2017-06-04 DIAGNOSIS — J189 Pneumonia, unspecified organism: Secondary | ICD-10-CM

## 2017-12-08 ENCOUNTER — Encounter (HOSPITAL_COMMUNITY): Payer: Self-pay | Admitting: Student

## 2017-12-08 ENCOUNTER — Inpatient Hospital Stay (HOSPITAL_COMMUNITY)
Admission: EM | Admit: 2017-12-08 | Discharge: 2017-12-13 | DRG: 871 | Disposition: A | Discharge status: Home / Self Care | Payer: Medicare (Managed Care) | Attending: Internal Medicine | Admitting: Internal Medicine

## 2017-12-08 ENCOUNTER — Emergency Department (HOSPITAL_COMMUNITY): Payer: Medicare (Managed Care)

## 2017-12-08 DIAGNOSIS — J690 Pneumonitis due to inhalation of food and vomit: Secondary | ICD-10-CM | POA: Diagnosis present

## 2017-12-08 DIAGNOSIS — R0689 Other abnormalities of breathing: Secondary | ICD-10-CM | POA: Diagnosis present

## 2017-12-08 DIAGNOSIS — L8990 Pressure ulcer of unspecified site, unspecified stage: Secondary | ICD-10-CM | POA: Diagnosis present

## 2017-12-08 DIAGNOSIS — F03C0 Unspecified dementia, severe, without behavioral disturbance, psychotic disturbance, mood disturbance, and anxiety: Secondary | ICD-10-CM | POA: Diagnosis present

## 2017-12-08 DIAGNOSIS — A419 Sepsis, unspecified organism: Principal | ICD-10-CM | POA: Diagnosis present

## 2017-12-08 DIAGNOSIS — Z79899 Other long term (current) drug therapy: Secondary | ICD-10-CM

## 2017-12-08 DIAGNOSIS — Z881 Allergy status to other antibiotic agents status: Secondary | ICD-10-CM

## 2017-12-08 DIAGNOSIS — R1310 Dysphagia, unspecified: Secondary | ICD-10-CM | POA: Diagnosis present

## 2017-12-08 DIAGNOSIS — F17200 Nicotine dependence, unspecified, uncomplicated: Secondary | ICD-10-CM | POA: Diagnosis present

## 2017-12-08 DIAGNOSIS — Z7401 Bed confinement status: Secondary | ICD-10-CM

## 2017-12-08 DIAGNOSIS — J181 Lobar pneumonia, unspecified organism: Secondary | ICD-10-CM | POA: Diagnosis present

## 2017-12-08 DIAGNOSIS — F0390 Unspecified dementia without behavioral disturbance: Secondary | ICD-10-CM | POA: Diagnosis present

## 2017-12-08 DIAGNOSIS — F0151 Vascular dementia with behavioral disturbance: Secondary | ICD-10-CM | POA: Diagnosis present

## 2017-12-08 DIAGNOSIS — I10 Essential (primary) hypertension: Secondary | ICD-10-CM | POA: Diagnosis present

## 2017-12-08 DIAGNOSIS — L89159 Pressure ulcer of sacral region, unspecified stage: Secondary | ICD-10-CM | POA: Diagnosis present

## 2017-12-08 DIAGNOSIS — G9340 Encephalopathy, unspecified: Secondary | ICD-10-CM | POA: Diagnosis present

## 2017-12-08 DIAGNOSIS — Z66 Do not resuscitate: Secondary | ICD-10-CM | POA: Diagnosis present

## 2017-12-08 DIAGNOSIS — J189 Pneumonia, unspecified organism: Secondary | ICD-10-CM

## 2017-12-08 DIAGNOSIS — Z882 Allergy status to sulfonamides status: Secondary | ICD-10-CM

## 2017-12-08 LAB — CBC WITH DIFFERENTIAL/PLATELET
Basophils Absolute: 0 10*3/uL (ref 0.0–0.1)
Basophils Relative: 0 %
EOS ABS: 0 10*3/uL (ref 0.0–0.7)
EOS PCT: 0 %
HCT: 34.2 % — ABNORMAL LOW (ref 36.0–46.0)
Hemoglobin: 11 g/dL — ABNORMAL LOW (ref 12.0–15.0)
LYMPHS ABS: 3.3 10*3/uL (ref 0.7–4.0)
Lymphocytes Relative: 13 %
MCH: 31.6 pg (ref 26.0–34.0)
MCHC: 32.2 g/dL (ref 30.0–36.0)
MCV: 98.3 fL (ref 78.0–100.0)
Monocytes Absolute: 1.3 10*3/uL — ABNORMAL HIGH (ref 0.1–1.0)
Monocytes Relative: 5 %
Neutro Abs: 20.6 10*3/uL — ABNORMAL HIGH (ref 1.7–7.7)
Neutrophils Relative %: 82 %
Platelets: 534 10*3/uL — ABNORMAL HIGH (ref 150–400)
RBC: 3.48 MIL/uL — AB (ref 3.87–5.11)
RDW: 14.7 % (ref 11.5–15.5)
WBC: 25.2 10*3/uL — AB (ref 4.0–10.5)

## 2017-12-08 LAB — COMPREHENSIVE METABOLIC PANEL
ALK PHOS: 175 U/L — AB (ref 38–126)
ALT: 69 U/L — ABNORMAL HIGH (ref 14–54)
ANION GAP: 12 (ref 5–15)
AST: 72 U/L — ABNORMAL HIGH (ref 15–41)
Albumin: 2.2 g/dL — ABNORMAL LOW (ref 3.5–5.0)
BUN: 20 mg/dL (ref 6–20)
CALCIUM: 8.7 mg/dL — AB (ref 8.9–10.3)
CO2: 26 mmol/L (ref 22–32)
Chloride: 100 mmol/L — ABNORMAL LOW (ref 101–111)
Creatinine, Ser: 0.75 mg/dL (ref 0.44–1.00)
GLUCOSE: 229 mg/dL — AB (ref 65–99)
POTASSIUM: 3.4 mmol/L — AB (ref 3.5–5.1)
Sodium: 138 mmol/L (ref 135–145)
Total Bilirubin: 0.7 mg/dL (ref 0.3–1.2)
Total Protein: 7 g/dL (ref 6.5–8.1)

## 2017-12-08 LAB — I-STAT CG4 LACTIC ACID, ED: Lactic Acid, Venous: 4.43 mmol/L (ref 0.5–1.9)

## 2017-12-08 LAB — PROTIME-INR
INR: 1.2
PROTHROMBIN TIME: 15.1 s (ref 11.4–15.2)

## 2017-12-08 MED ORDER — SODIUM CHLORIDE 0.9 % IV BOLUS (SEPSIS)
1000.0000 mL | Freq: Once | INTRAVENOUS | Status: AC
  Administered 2017-12-08: 1000 mL via INTRAVENOUS

## 2017-12-08 MED ORDER — SODIUM CHLORIDE 0.9 % IV SOLN
500.0000 mg | INTRAVENOUS | Status: DC
  Administered 2017-12-08 – 2017-12-11 (×4): 500 mg via INTRAVENOUS
  Filled 2017-12-08 (×5): qty 500

## 2017-12-08 MED ORDER — SODIUM CHLORIDE 0.9 % IV SOLN
2.0000 g | INTRAVENOUS | Status: DC
Start: 2017-12-08 — End: 2017-12-13
  Administered 2017-12-08 – 2017-12-12 (×5): 2 g via INTRAVENOUS
  Filled 2017-12-08 (×6): qty 20

## 2017-12-08 MED ORDER — SODIUM CHLORIDE 0.9 % IV BOLUS (SEPSIS)
500.0000 mL | Freq: Once | INTRAVENOUS | Status: AC
  Administered 2017-12-08: 500 mL via INTRAVENOUS

## 2017-12-08 NOTE — ED Triage Notes (Signed)
[facility] for SOB. Per EMS pt sent out for increased resp effort per staff, no recent hospitalizations. Pt sats 85% PTA, 97% on neb tx; received 1 duoneb and albuterol tx and 125 mg solumedrol PTA.   Pt daughter called this RN on pt arrival to ED, states pt has dementia at baseline and does not speak very often.

## 2017-12-08 NOTE — ED Notes (Signed)
Attempted in and out cath on pt with 1 RN and 2 techs. Pt clenching legs together, and unable to visualize urinary meatus at this time. Purewick applied to pt at this time.

## 2017-12-08 NOTE — ED Notes (Signed)
Code sepsis activated RN Jessica aware 

## 2017-12-08 NOTE — ED Notes (Signed)
I Stat Lactic Acid results of 4.43 reported to Dr. Particia NearingHaviland

## 2017-12-08 NOTE — ED Provider Notes (Signed)
[facility] EMERGENCY DEPARTMENT Provider Note   CSN: [id number] Arrival date & time: 12/08/17  2045     History   Chief Complaint Chief Complaint  Patient presents with  . Shortness of Breath    HPI Kristy Cruz is a 84 y.o. female with a hx of prior tobacco use, HTN, and dementia who presents the emergency department from nursing home for increased work of breathing that started last night.  Level 5 caveat secondary to dementia-patient is generally nonverbal, occasionally will say a few words. -Per nursing home facility ([facility])-discussed with Victorino Dike on the clinical team via phone: Patient has been somewhat congested, she seemed to have some heavy breathing last night therefore she was set up.  She continued to have some increased work of breathing prompting EMS call.  Patient received Tylenol at 8:00. -Per EMS report patient from nursing home for dyspnea, on arrival SPO2 85% on room air, 97% on neb treatment.  She received 1 DuoNeb 125 mg of Solu-Medrol prior to arrival. -Patient nonverbal on my evaluation.   HPI  Past Medical History:  Diagnosis Date  . Hypertension     There are no active problems to display for this patient.   Past Surgical History:  Procedure Laterality Date  . VASCULAR SURGERY       OB History   None      Home Medications    Prior to Admission medications   Medication Sig Start Date End Date Taking? Authorizing Provider  FLUoxetine (PROZAC) 10 MG tablet Take 10 mg by mouth daily.   Yes [provider]  Nutritional Supplements (NUTRITIONAL SUPPLEMENT PO) Take 240 mLs by mouth daily. "Mighty shake"   Yes [provider]  senna-docusate (SENOKOT-S) 8.6-50 MG per tablet Take 1 tablet by mouth at bedtime.    Yes [provider]  Skin Protectants, Misc. (CRITIC-AID THICK MOIST BARRIER) PSTE Apply 1 application topically See admin instructions. Apply topically to bottom every day  and night shift for skin protectant and after each incontinence episode   Yes [provider]  Skin Protectants, Misc. (EUCERIN) cream Apply 1 application topically 2 (two) times daily. Apply to lower legs   Yes [provider]  ARIPiprazole (ABILIFY) 5 MG tablet Take 2.5 mg by mouth daily.    [provider]  cholecalciferol (VITAMIN D) 1000 units tablet Take 1,000 Units by mouth daily.    [provider]  ENSURE (ENSURE) Take by mouth daily. 8 oz    [provider]  ferrous sulfate 325 (65 FE) MG tablet Take 325 mg by mouth daily.    [provider]  fluocinonide cream (LIDEX) 0.05 % Apply 1 application topically 2 (two) times daily. Apply to sacrum, behind ears, & rash on lower leg twice daily    [provider]  furosemide (LASIX) 20 MG tablet Take 20 mg by mouth daily.    [provider]  HYDROcodone-acetaminophen (NORCO/VICODIN) 5-325 MG per tablet Take 1 tablet by mouth 2 (two) times daily as needed for moderate pain.     [provider]  lisinopril (PRINIVIL,ZESTRIL) 10 MG tablet Take 10 mg by mouth daily.    [provider]    Family History History reviewed. No pertinent family history.  Social History Social History   Tobacco Use  . Smoking status: Current Some Day Smoker  Substance Use Topics  . Alcohol use: No  . Drug use: No     Allergies  Sulfa drugs cross reactors and Sulfa antibiotics   Review of Systems Review of Systems  Unable to perform ROS: Dementia  HENT: Positive for congestion.   Respiratory: Positive for shortness of breath.     Physical Exam Updated Vital Signs BP 125/61 (BP Location: Left Arm)   Pulse (!) 115   Temp 100 F (37.8 C) (Rectal)   Resp (!) 24   Wt 47.8 kg (105 lb 6.1 oz)   SpO2 97%   Physical Exam  Constitutional:  Thin appearing caucasian female.   HENT:  Right Ear: Tympanic membrane normal.  Left Ear: Tympanic membrane normal.  Nose:  Mucosal edema present.  Eyes: Pupils are equal, round, and reactive to light.  Cardiovascular: Tachycardia present.  No murmur heard. Pulmonary/Chest: Tachypnea noted. She has decreased breath sounds (throughout). She has no wheezes. She has rales (bibasilar).  Abdominal: Soft. She exhibits no distension. There is no tenderness.  Musculoskeletal: She exhibits no edema or tenderness.  Neurological: She is alert.  Patient is generally nonverbal at baseline.  Skin: Skin is warm and dry.   ED Treatments / Results  Labs Results for orders placed or performed during the hospital encounter of 12/08/17  Comprehensive metabolic panel  Result Value Ref Range   Sodium 138 135 - 145 mmol/L   Potassium 3.4 (L) 3.5 - 5.1 mmol/L   Chloride 100 (L) 101 - 111 mmol/L   CO2 26 22 - 32 mmol/L   Glucose, Bld 229 (H) 65 - 99 mg/dL   BUN 20 6 - 20 mg/dL   Creatinine, Ser 9.620.75 0.44 - 1.00 mg/dL   Calcium 8.7 (L) 8.9 - 10.3 mg/dL   Total Protein 7.0 6.5 - 8.1 g/dL   Albumin 2.2 (L) 3.5 - 5.0 g/dL   AST 72 (H) 15 - 41 U/L   ALT 69 (H) 14 - 54 U/L   Alkaline Phosphatase 175 (H) 38 - 126 U/L   Total Bilirubin 0.7 0.3 - 1.2 mg/dL   GFR calc non Af Amer >60 >60 mL/min   GFR calc Af Amer >60 >60 mL/min   Anion gap 12 5 - 15  CBC with Differential  Result Value Ref Range   WBC 25.2 (H) 4.0 - 10.5 K/uL   RBC 3.48 (L) 3.87 - 5.11 MIL/uL   Hemoglobin 11.0 (L) 12.0 - 15.0 g/dL   HCT 95.234.2 (L) 84.136.0 - 32.446.0 %   MCV 98.3 78.0 - 100.0 fL   MCH 31.6 26.0 - 34.0 pg   MCHC 32.2 30.0 - 36.0 g/dL   RDW 40.114.7 02.711.5 - 25.315.5 %   Platelets 534 (H) 150 - 400 K/uL   Neutrophils Relative % 82 %   Lymphocytes Relative 13 %   Monocytes Relative 5 %   Eosinophils Relative 0 %   Basophils Relative 0 %   Neutro Abs 20.6 (H) 1.7 - 7.7 K/uL   Lymphs Abs 3.3 0.7 - 4.0 K/uL   Monocytes Absolute 1.3 (H) 0.1 - 1.0 K/uL   Eosinophils Absolute 0.0 0.0 - 0.7 K/uL   Basophils Absolute 0.0 0.0 - 0.1 K/uL   RBC Morphology POLYCHROMASIA  PRESENT   Protime-INR  Result Value Ref Range   Prothrombin Time 15.1 11.4 - 15.2 seconds   INR 1.20   I-Stat CG4 Lactic Acid, ED  Result Value Ref Range   Lactic Acid, Venous 4.43 (HH) 0.5 - 1.9 mmol/L   Comment NOTIFIED PHYSICIAN   EKG EKG Interpretation  Date/Time:  Sunday December 08 2017 20:54:31 EDT  Ventricular Rate:  113 PR Interval:    QRS Duration: 127 QT Interval:  354 QTC Calculation: 486 R Axis:   -65 Text Interpretation:  Sinus tachycardia RBBB and LAFB No significant change since last tracing Confirmed by Jacalyn Lefevre [phone]) on 12/08/2017 11:50:35 PM   Radiology Dg Chest Port 1 View  Result Date: 12/08/2017 CLINICAL DATA:  Short of breath EXAM: PORTABLE CHEST 1 VIEW COMPARISON:  06/04/2017 FINDINGS: Mild cardiomegaly with vascular congestion. Mild interstitial and alveolar opacity in the right mid to lower lung peripherally. No significant effusion. Aortic atherosclerosis. No pneumothorax. IMPRESSION: 1. Interstitial and alveolar opacity in the right mid to lower lung, may reflect pneumonia 2. There is cardiomegaly and mild vascular congestion. Electronically Signed   By: Jasmine Pang M.D.   On: 12/08/2017 22:29    Procedures Procedures (including critical care time)  CRITICAL CARE Performed by: Harvie Heck   Total critical care time: 36 minutes for Sepsis with pneumonia  Critical care time was exclusive of separately billable procedures and treating other patients.  Critical care was necessary to treat or prevent imminent or life-threatening deterioration.  Critical care was time spent personally by me on the following activities: development of treatment plan with patient and/or surrogate as well as nursing, discussions with consultants, evaluation of patient's response to treatment, examination of patient, obtaining history from patient or surrogate, ordering and performing treatments and interventions, ordering and review of laboratory studies,  ordering and review of radiographic studies, pulse oximetry and re-evaluation of patient's condition.   Medications Ordered in ED Medications  cefTRIAXone (ROCEPHIN) 2 g in sodium chloride 0.9 % 100 mL IVPB (0 g Intravenous Stopped 12/08/17 2303)  azithromycin (ZITHROMAX) 500 mg in sodium chloride 0.9 % 250 mL IVPB (has no administration in time range)  sodium chloride 0.9 % bolus 1,000 mL (0 mLs Intravenous Stopped 12/08/17 2302)    And  sodium chloride 0.9 % bolus 500 mL (500 mLs Intravenous New Bag/Given 12/08/17 2303)   Initial Impression / Assessment and Plan / ED Course  I have reviewed the triage vital signs and the nursing notes.  Pertinent labs & imaging results that were available during my care of the patient were reviewed by me and considered in my medical decision making (see chart for details).   Patient presents with increased work of breathing from nursing home facility-patient is nonverbal secondary to dementia, history provided by EMS and nursing home.  She is tachycardic, tachypneic, and with temperature of 100 after receiving Tylenol 1 hour prior to arrival.  Sepsis workup initiated.  Patient with decreased breath sounds and rales.  Given presentation suspicious for pneumonia with vitals code sepsis activated with orders for weight-based fluids and community-acquired pneumonia coverage.   Labs notable for lactic acid of 4.43, leukocytosis of 25.2.  Mild hypokalemia at 3.4, mild hypocalcemia at 8.7.  Alkaline phosphatase and LFTs are somewhat elevated. EKG without significant change from previous  CXR with interstitial and alveolar opacity in the right mid to lower lung, may reflect pneumonia, cardiomegaly and mild vascular congestion.  22:06: I discussed findings and plan for admission with patient's daughter via the phone.  She is from [facility], she plans to come tomorrow morning to see her mother.   23:29: CONSULT: Discussed case with Dr. Nelson Chimes with internal  medicine- accepts admission.   Findings and plan of care discussed with supervising physician Dr. Particia Nearing who evaluated patient and is in agreement with plan.  Final Clinical Impressions(s) / ED Diagnoses  Final diagnoses:  Sepsis, due to unspecified organism [facility])    ED Discharge Orders    None       Desmond Lope 12/09/17 Dinah Beers, MD 12/12/17 1000

## 2017-12-08 NOTE — H&P (Signed)
Date: 12/09/2017               Patient Name:  Kristy Cruz MRN: [id number]  DOB: 1929/05/07 Age / Sex: 84 y.o., female   PCP: Janifer Adie, MD         Medical Service: Internal Medicine Teaching Service         Attending Physician: Dr. Evette Doffing, Mallie Mussel, *    First Contact: Dr. Pearson Grippe Pager: [phone]  Second Contact: Dr. Ledell Noss Pager: [phone]       After Hours (After 5p/  First Contact Pager: [phone]  weekends / holidays): Second Contact Pager: [phone]   Chief Complaint: Increased work of breathing.  History of Present Illness:  Kristy Cruz is 84 yo with PMH of HTN, vascular dementia, and paranoid schizophrenia who presents with increased work of breathing noticed by staff at patient's skilled nursing facility. History unable to be obtained directly from patient 2/2 baseline dementia which makes her mostly non-verbal at baseline. History mainly obtained from chart review. Patient was reportedly congested at nursing home and exhibited increased work of breathing. Patient brought in by EMS and was noticed to desaturate to 85% on room air, which increased to >92% on 2L [place], solumedrol, albuterol, and DuoNeb breathing treatments. No fevers reported by EMS or facility but patient was noted to have Tylenol at 8:00pm prior to arrival.   Upon arrival to ED patient afebrile (T max of 37.8), tachycardic to 110s, hypotensive to normotensive on arrival (lowest BP 100s/50s), and saturating >97% on 2L. CBC with WBC = 31.5, with neutrophilic predominance, and Hgb=11.0. CMP remarkable for K =3.4, Glucose 220, AST/ALT =72/69 respectively, and alk phos of 175. Patients initial lactate = 4.43, with repeat of 5.12. EKG showed no acute changes. Chest X ray showed insterstitial and alveolar opacity in right mid/lower lung consistent with pneumonia. Given possible pneumonia, leukocytosis, and abnormal vital signs with new oxygen requirement, code sepsis was called. She was given ceftriaxone,  azithromycin, 2.0L NS, and blood/urine cultures were obtained. IMTS was called for admission.  Meds:  Current Meds  Medication Sig  . acetaminophen (TYLENOL) 650 MG CR tablet Take 650 mg by mouth 3 (three) times daily.  . barrier cream (NON-SPECIFIED) CREA Apply 1 application topically See admin instructions. Apply Barrier Cream ([facility] provides) four times daily to inner and outer buttocks and as needed with any incontinence care to denuded areas (continue to off load pressure every 1-2 hours)  . fluocinonide cream (LIDEX) 1.76 % Apply 1 application topically See admin instructions. Apply topically to legs and behind ears every 6 hours as needed for itching  . FLUoxetine (PROZAC) 10 MG tablet Take 10 mg by mouth daily.  . Nutritional Supplements (NUTRITIONAL SUPPLEMENT PO) Take 240 mLs by mouth daily. "Mighty shake"  . polyethylene glycol (MIRALAX / GLYCOLAX) packet Take 17 g by mouth every 8 (eight) hours as needed (constipation).  Marland Kitchen senna-docusate (SENOKOT-S) 8.6-50 MG per tablet Take 1 tablet by mouth at bedtime.   . Skin Protectants, Misc. (CRITIC-AID THICK MOIST BARRIER) PSTE Apply 1 application topically See admin instructions. Apply topically to bottom every day and night shift for skin protectant and after each incontinence episode  . Skin Protectants, Misc. (EUCERIN) cream Apply 1 application topically 2 (two) times daily. Apply to lower legs   Allergies: Allergies as of 12/08/2017 - Review Complete 12/08/2017  Allergen Reaction Noted  . Sulfa drugs cross reactors Anxiety 10/29/2011  . Sulfa antibiotics Other (See Comments) 12/08/2017  Past Medical History: Past Medical History:  Diagnosis Date  . Hypertension    Past Surgical History: Past Surgical History:  Procedure Laterality Date  . VASCULAR SURGERY     Family History:  Unable to obtain 2/2 patient's dementia which makes her generally non-verbal at baseline.   Social History:  Patient resides at SNF permanently  ([facility] per chart review). Remote smoking history per chart review. Patient has daughter who is involved in her care.  Review of Systems: A complete ROS was negative except as per HPI.   Physical Exam: Blood pressure (!) 141/78, pulse 99, temperature (!) 96.9 F (36.1 C), temperature source Rectal, resp. rate 14, weight 105 lb 6.1 oz (47.8 kg), SpO2 97 %.  Physical Exam  Constitutional:  Thin, elderly woman laying in bed, non-diaphoretic and in no acute distress.  HENT:  Head: Normocephalic.  No cracked lips.  Eyes: Pupils are equal, round, and reactive to light. Conjunctivae are normal.  Patient spontaneously moves eyes in all directions.   Cardiovascular: Regular rhythm and intact distal pulses. Exam reveals no friction rub.  Murmur (holosystolic murmur heard best at L sternal border) heard. Tachycardic  Respiratory: No stridor.  Patient laying comfortably with 2L [place] in place. NO nasal flaring or accessory muscle use. End expiratory wheezing in apical lung fields. Decreased breath sounds in lower lung fields. No definite crackles appreciated, but exam limited 2/2 patient participation.  GI: Soft. Bowel sounds are normal. She exhibits no distension. There is no tenderness.  Musculoskeletal: She exhibits no edema (of bilateral lower extremities) or tenderness (of bilateral lower extremities).  Lymphadenopathy:    She has no cervical adenopathy.  Neurological:  Patient non-communicative during interview. Arouses to voice but does not follow commands. Face symmetric. Spontaneously moves all four extremities, however ROM limited 2/2 bilateral upper and lower extremity stiffness.  Skin: Skin is warm and dry. No rash noted. No erythema.   EKG: personally reviewed my interpretation is sinus tachycardia with RBBB noted on prior EKG from 12/27/2015. No other signs of ST elevation or new TWI to suggest acute ischemia.   CXR: personally reviewed my interpretation is opacity  which partially obscures the right heart border, no associated pleural effusions or significant vascular congestion.   Assessment & Plan by Problem: Active Problems:   Sepsis due to pneumonia [facility])  Kristy Cruz is 84 yo with PMH of HTN, vascular dementia, and paranoid schizophrenia who presents with increased work of breathing noticed by staff at patient's skilled nursing facility. She met sepsis criteria on admission with evidence of RML/RLL pneumonia on imaging. She was admitted to the internal medicine teaching service for management. The specific problems addressed during admission are as follows:  Sepsis 2/2 pneumonia: Patient presented with increased WOB, increased oxygen requirement, leukocytosis, elevated lactate, tachycardia, and RML/RLL infiltrate on chest imaging, altogether suggesting sepsis 2/2 pneumonia. In ED she was started on ceftriaxone, azithromycin to treat community acquired pneumonia. Blood cultures were also obtained and patient received aggressive IV fluid resuscitation (2.0L) in total per sepsis protocol. Will continue to trend lactate overnight and infectious workup, including blood cultures and urinalysis, started in ED. Patient's chest imaging with possible R middle lobe infiltration and history of dementia (mostly bed bound) are consistent with aspiration pneumonia as a cause of her infection. Will order speech evaluation to examine for aspiration at baseline. IF she does have aspiration, it would be prudent to switch from ceftriaxone to unasyn to cover for anaerobes. Also given the presence  of lobar infiltrate, the source of her infection is likely to be atypical organism that requires treatment with azithromycin, so could consider further narrowing antibiotic regimen with discontinuation of this medication. -Admit to med-surg -Trend lactic acid overnight -F/u blood cultures taken on admission -F/u urinalysis, urinary strep antigen from admission -Repeat CBC in  AM -Consider switching to Unasyn in AM to cover for possibility of aspiration pneumonia -SLP evaluation -Continue DuoNebs q6 hours  -NS + 20 mEq K @ rate of 75 ml/hr -Tylenol for pain/fever -Call nursing home/dauther in AM to obtain collateral information  HTN: BP elevated to 140s/100s in ED. Patient not on any anti-hypertensive medications at home per [facility] review. Will continue to monitor while patient on treatment for pneumonia and make adjustments as needed. -Continue to monitor while inpatient  Vascular dementia: Patient reportedly has history of vascular dementia with behavioral disturbance and paranoid schizophrenia per nursing home MAR. Patient's only medication is daily fluoxetine.  -Delirium precautions -Daily fluoxetine 10 mg  FEN/GI: -NPO pending speech evaluation, diet per their recommendations -NS + 20 mEq K @ rate of 75 ml/hr  VTE Prophylaxis: Lovenox daily Code Status: DNR (confirmed by transport paperwork on admission)  Dispo: Admit patient to Inpatient with expected length of stay greater than 2 midnights.  SignedThomasene Ripple, MD 12/09/2017, 12:13 AM  Pager: [phone]

## 2017-12-09 ENCOUNTER — Other Ambulatory Visit: Payer: Self-pay

## 2017-12-09 DIAGNOSIS — F200 Paranoid schizophrenia: Secondary | ICD-10-CM | POA: Diagnosis not present

## 2017-12-09 DIAGNOSIS — R1310 Dysphagia, unspecified: Secondary | ICD-10-CM | POA: Diagnosis present

## 2017-12-09 DIAGNOSIS — D72828 Other elevated white blood cell count: Secondary | ICD-10-CM | POA: Diagnosis not present

## 2017-12-09 DIAGNOSIS — R0689 Other abnormalities of breathing: Secondary | ICD-10-CM | POA: Diagnosis not present

## 2017-12-09 DIAGNOSIS — Z66 Do not resuscitate: Secondary | ICD-10-CM | POA: Diagnosis present

## 2017-12-09 DIAGNOSIS — R011 Cardiac murmur, unspecified: Secondary | ICD-10-CM

## 2017-12-09 DIAGNOSIS — Z882 Allergy status to sulfonamides status: Secondary | ICD-10-CM

## 2017-12-09 DIAGNOSIS — R740 Nonspecific elevation of levels of transaminase and lactic acid dehydrogenase [LDH]: Secondary | ICD-10-CM | POA: Diagnosis not present

## 2017-12-09 DIAGNOSIS — Z87891 Personal history of nicotine dependence: Secondary | ICD-10-CM

## 2017-12-09 DIAGNOSIS — F17200 Nicotine dependence, unspecified, uncomplicated: Secondary | ICD-10-CM | POA: Diagnosis present

## 2017-12-09 DIAGNOSIS — A419 Sepsis, unspecified organism: Secondary | ICD-10-CM | POA: Diagnosis present

## 2017-12-09 DIAGNOSIS — Z7401 Bed confinement status: Secondary | ICD-10-CM | POA: Diagnosis not present

## 2017-12-09 DIAGNOSIS — I10 Essential (primary) hypertension: Secondary | ICD-10-CM | POA: Diagnosis present

## 2017-12-09 DIAGNOSIS — J189 Pneumonia, unspecified organism: Secondary | ICD-10-CM | POA: Diagnosis present

## 2017-12-09 DIAGNOSIS — Z881 Allergy status to other antibiotic agents status: Secondary | ICD-10-CM | POA: Diagnosis not present

## 2017-12-09 DIAGNOSIS — G9340 Encephalopathy, unspecified: Secondary | ICD-10-CM | POA: Diagnosis present

## 2017-12-09 DIAGNOSIS — R479 Unspecified speech disturbances: Secondary | ICD-10-CM | POA: Diagnosis not present

## 2017-12-09 DIAGNOSIS — F0150 Vascular dementia without behavioral disturbance: Secondary | ICD-10-CM

## 2017-12-09 DIAGNOSIS — Z79899 Other long term (current) drug therapy: Secondary | ICD-10-CM

## 2017-12-09 DIAGNOSIS — F0151 Vascular dementia with behavioral disturbance: Secondary | ICD-10-CM | POA: Diagnosis not present

## 2017-12-09 DIAGNOSIS — L89159 Pressure ulcer of sacral region, unspecified stage: Secondary | ICD-10-CM | POA: Diagnosis present

## 2017-12-09 DIAGNOSIS — J181 Lobar pneumonia, unspecified organism: Secondary | ICD-10-CM | POA: Diagnosis present

## 2017-12-09 DIAGNOSIS — J690 Pneumonitis due to inhalation of food and vomit: Secondary | ICD-10-CM | POA: Diagnosis present

## 2017-12-09 LAB — CBC
HEMATOCRIT: 33 % — AB (ref 36.0–46.0)
Hemoglobin: 10.2 g/dL — ABNORMAL LOW (ref 12.0–15.0)
MCH: 31 pg (ref 26.0–34.0)
MCHC: 30.9 g/dL (ref 30.0–36.0)
MCV: 100.3 fL — AB (ref 78.0–100.0)
PLATELETS: 438 10*3/uL — AB (ref 150–400)
RBC: 3.29 MIL/uL — ABNORMAL LOW (ref 3.87–5.11)
RDW: 14.9 % (ref 11.5–15.5)
WBC: 21.6 10*3/uL — AB (ref 4.0–10.5)

## 2017-12-09 LAB — I-STAT CG4 LACTIC ACID, ED: Lactic Acid, Venous: 5.12 mmol/L (ref 0.5–1.9)

## 2017-12-09 LAB — LACTIC ACID, PLASMA
LACTIC ACID, VENOUS: 2.1 mmol/L — AB (ref 0.5–1.9)
Lactic Acid, Venous: 3.7 mmol/L (ref 0.5–1.9)

## 2017-12-09 LAB — BASIC METABOLIC PANEL
Anion gap: 11 (ref 5–15)
BUN: 15 mg/dL (ref 6–20)
CHLORIDE: 109 mmol/L (ref 101–111)
CO2: 22 mmol/L (ref 22–32)
CREATININE: 0.69 mg/dL (ref 0.44–1.00)
Calcium: 8.1 mg/dL — ABNORMAL LOW (ref 8.9–10.3)
GFR calc Af Amer: >60 mL/min (ref >60)
Glucose, Bld: 263 mg/dL — ABNORMAL HIGH (ref 65–99)
Potassium: 3.8 mmol/L (ref 3.5–5.1)
SODIUM: 142 mmol/L (ref 135–145)

## 2017-12-09 LAB — URINALYSIS, ROUTINE W REFLEX MICROSCOPIC
BACTERIA UA: NONE SEEN
Bilirubin Urine: NEGATIVE
Glucose, UA: 50 mg/dL — AB
KETONES UR: NEGATIVE mg/dL
Leukocytes, UA: NEGATIVE
Nitrite: NEGATIVE
PH: 6 (ref 5.0–8.0)
Protein, ur: NEGATIVE mg/dL
SPECIFIC GRAVITY, URINE: 1.009 (ref 1.005–1.030)

## 2017-12-09 LAB — STREP PNEUMONIAE URINARY ANTIGEN: Strep Pneumo Urinary Antigen: NEGATIVE

## 2017-12-09 LAB — INFLUENZA PANEL BY PCR (TYPE A & B)
INFLAPCR: NEGATIVE
INFLBPCR: NEGATIVE

## 2017-12-09 LAB — CK: CK TOTAL: 16 U/L — AB (ref 38–234)

## 2017-12-09 LAB — MRSA PCR SCREENING: MRSA BY PCR: NEGATIVE

## 2017-12-09 MED ORDER — SODIUM CHLORIDE 0.9% FLUSH
3.0000 mL | Freq: Two times a day (BID) | INTRAVENOUS | Status: DC
  Administered 2017-12-10 – 2017-12-13 (×4): 3 mL via INTRAVENOUS

## 2017-12-09 MED ORDER — SODIUM CHLORIDE 0.9 % IV BOLUS
500.0000 mL | Freq: Once | INTRAVENOUS | Status: AC
  Administered 2017-12-09: 500 mL via INTRAVENOUS

## 2017-12-09 MED ORDER — ACETAMINOPHEN 325 MG PO TABS: 650.0000 mg | ORAL_TABLET | Freq: Four times a day (QID) | ORAL | Status: DC | PRN

## 2017-12-09 MED ORDER — FLUOXETINE HCL 10 MG PO CAPS
10.0000 mg | ORAL_CAPSULE | Freq: Every day | ORAL | Status: DC
  Administered 2017-12-10: 10 mg via ORAL
  Filled 2017-12-09 (×2): qty 1

## 2017-12-09 MED ORDER — SODIUM CHLORIDE 0.9 % IV SOLN
INTRAVENOUS | Status: DC
  Administered 2017-12-09: 13:00:00 via INTRAVENOUS

## 2017-12-09 MED ORDER — ONDANSETRON HCL 4 MG PO TABS: 4.0000 mg | ORAL_TABLET | Freq: Four times a day (QID) | ORAL | Status: DC | PRN

## 2017-12-09 MED ORDER — POLYETHYLENE GLYCOL 3350 17 G PO PACK: 17.0000 g | PACK | Freq: Every day | ORAL | Status: DC | PRN

## 2017-12-09 MED ORDER — ONDANSETRON HCL 4 MG/2ML IJ SOLN: 4.0000 mg | Freq: Four times a day (QID) | INTRAMUSCULAR | Status: DC | PRN

## 2017-12-09 MED ORDER — POTASSIUM CHLORIDE IN NACL 20-0.9 MEQ/L-% IV SOLN
INTRAVENOUS | Status: DC
  Administered 2017-12-09: 02:00:00 via INTRAVENOUS
  Filled 2017-12-09: qty 1000

## 2017-12-09 MED ORDER — IPRATROPIUM-ALBUTEROL 0.5-2.5 (3) MG/3ML IN SOLN
3.0000 mL | Freq: Four times a day (QID) | RESPIRATORY_TRACT | Status: DC
  Filled 2017-12-09: qty 3

## 2017-12-09 MED ORDER — IPRATROPIUM-ALBUTEROL 0.5-2.5 (3) MG/3ML IN SOLN
3.0000 mL | Freq: Four times a day (QID) | RESPIRATORY_TRACT | Status: DC | PRN
  Filled 2017-12-09: qty 3

## 2017-12-09 MED ORDER — ACETAMINOPHEN 650 MG RE SUPP: 650.0000 mg | Freq: Four times a day (QID) | RECTAL | Status: DC | PRN

## 2017-12-09 MED ORDER — ENOXAPARIN SODIUM 40 MG/0.4ML ~~LOC~~ SOLN: 40.0000 mg | SUBCUTANEOUS | Status: DC

## 2017-12-09 NOTE — ED Notes (Signed)
Attempted to call report

## 2017-12-09 NOTE — Clinical Social Work Note (Signed)
Clinical Social Work Assessment  Patient Details  Name: Kristy Cruz MRN: [id number] Date of Birth: 01/23/1929  Date of referral:  12/09/17               Reason for consult:  Discharge Planning                Permission sought to share information with:  Family Supports, Other([facility] SW's) Permission granted to share information::  No(Patient is non-verbal)  Name::     Butler DenmarkJanet Pannell and Meade MawJoanne Salata  Agency::     Relationship::  Marylu LundJanet - LCSW with [facility] progranm and Randa EvensJoanne - daughter  Contact Information:  Marylu LundJanet - [phone] and Randa EvensJoanne - [phone]  Housing/Transportation Living arrangements for the past 2 months:  [facility]isted [facility] Source of Information:  Other (Comment Required), Adult Children([facility] SW and daughter) Patient Interpreter Needed:  None Criminal Activity/Legal Involvement Pertinent to Current Situation/Hospitalization:  No - Comment as needed Significant Relationships:  Adult Children, Other(Comment)([facility] SW) Lives with:  Facility Resident([facility]) Do you feel safe going back to the place where you live?  Yes Need for family participation in patient care:  Yes (Comment)  Care giving concerns:  No concerns cited by daughter or [facility] SW regarding patient's care at ALF.  Social Worker assessment / plan:  CSW talked with [facility] LCSW, Leighton RuffJanet Pennell on 35M after she visited with patient. Marylu LundJanet confirmed that patient has been enrolled with [facility] since 2013, and although she is in an ALF, her daughter advocated for patient to remain in [facility]. Marylu LundJanet reported that at baseline patient is quiet and does not self-advocate. Per Marylu LundJanet, patient came from [place] to this country. CSW also spoke briefly by phone with patient's daughter Randa EvensJoanne who reported that patient is with [facility] and will return to ALF.   Employment status:  Retired Health and safety inspectornsurance information:  Other (Comment Required)([facility] is payor) PT Recommendations:  Not assessed at this  time Information / Referral to community resources:  Other (Comment Required)(None needed or requested as patient is a [facility] participant)  Patient/Family's Response to care:  No concerns expressed by [facility] SW or daughter regarding patient's care during hospitalization.  Patient/Family's Understanding of and Emotional Response to Diagnosis, Current Treatment, and Prognosis: [facility] SW is knowledgeable regarding patient's past medical history and reason for admission.  Emotional Assessment Appearance:  Other (Comment Required(Did not visit with patient) Attitude/Demeanor/Rapport:  Unable to Assess(Patient not verbal; did not visit with patient) Affect (typically observed):  Unable to Assess(Patient non-verbal) Orientation:  Oriented to Self(Per nursing, unable to assess orientation as patient non-verbal and will not nod to questions.) Alcohol / Substance use:  Tobacco Use, Alcohol Use, Illicit Drugs(Remote history of smoking. No other informatoin provided.) Psych involvement (Current and /or in the community):  No (Comment)  Discharge Needs  Concerns to be addressed:  Discharge Planning Concerns Readmission within the last 30 days:  No Current discharge risk:  None Barriers to Discharge:  Continued Medical Work up   CHS IncCrawford, Lazaro ArmsVanessa Bradley, LCSW 12/09/2017, 3:07 PM

## 2017-12-09 NOTE — ED Notes (Signed)
Pt under admitting team. Jessica-RN and Sam-PA notified of elevated CG-4.

## 2017-12-09 NOTE — Progress Notes (Signed)
Attempted to get report.  RN unavailable.  Secretary will have RN call back when available.  Bernie CoveyKimberly Mikel Hardgrove RN-BC, [employer]igroupWTA

## 2017-12-09 NOTE — Progress Notes (Signed)
   Subjective: Kristy Cruz was seen on rounds this morning. She remains nonverbal and did not interact with [place] today. She did not appear to be in any pain or distress.   Objective:  Vital signs in last 24 hours: Vitals:   12/09/17 0200 12/09/17 0215 12/09/17 0312 12/09/17 0939  BP: 117/66 117/64 (!) 144/72 (!) 151/80  Pulse: 85 87 86 80  Resp: 18 (!) _0 Temp:   (!) 97.5 F (36.4 C) (!) 97.5 F (36.4 C)  TempSrc:   Oral Oral  SpO2: 97% 98% 99% 97%  Weight:   107 lb 9.6 oz (48.8 kg)    Physical Exam  Constitutional: No distress.  Thin, elderly female, nonverbal  HENT:  Head: Normocephalic and atraumatic.  Cardiovascular: Normal rate, regular rhythm and intact distal pulses.  No murmur heard. Pulmonary/Chest: Effort normal and breath sounds normal. No respiratory distress.  Abdominal: Soft. Bowel sounds are normal. She exhibits no distension. There is no tenderness.  Musculoskeletal: She exhibits no edema or deformity.  Neurological: She is alert.  Skin: Skin is warm and dry.   Assessment/Plan: 84 yo with PMH of HTN, vascular dementia, and paranoid schizophrenia who presents with increased work of breathing noticed by staff at patient's skilled nursing facility.   Sepsis: Resolved Pneumonia: Patient presented increased work of breathing, WBC 25, Lactate 4.32, Tachycardia and RML/RLL infiltrate on CXR. She was started on ceftriaxone, azithromycin for suspected pneumonia and IVF per sepsis protocol as she met criteria for this. Concern for aspiration pneumonia as patent has history of dementia, is nonverbal, and lives at a facility. > Afebrile, Leukocytosis down trending 25.2>>21.6 > Lactate trended down with IVF: 4.43>>5.12>>3.7>>2.1 > Blood Cx x2: Pending; U/A: Mod Hgb, 50 Glu, 0-5 Squam > Strep ur Ag: Negative; Flu Panel: Negative - Ceftriaxone and Azithromycin (Day 2) - Continue DuoNebs q6h PRN  - CBC in AM  Vascular dementia: Reportedly history of vascular  dementia with behavioral disturbance and paranoid schizophrenia per nursing home MAR. Patient's only medication is daily fluoxetine.  - Delirium precautions - Fluoxetine 72m Daily  HTN: BP elevated to [id number] here. Patient not on anti-hypertensives per MAR. -Continue to monitor while inpatient  FEN/GI: - Will evaluate records for previous diet - NS _1  ml/hr VTE Prophylaxis: Lovenox Code Status: DNR (confirmed by transport paperwork on admission)  Dispo: Anticipated discharge in approximately 1-2 day(s).   MNeva Seat MD 12/09/2017, 12:01 PM Pager: [phone]

## 2017-12-09 NOTE — Care Management Note (Addendum)
Case Management Note  Patient Details  Name: Kristy Cruz MRN: [id number] Date of Birth: 05/29/1929  Subjective/Objective:    History of HTN, vascular dementia, & paranoid schizophrenia; Admitted for Sepsis due to pneumonia.       Action/Plan: [facility] admission [facility]  Pl[facility] to [name] per CSW arrangements;   Expected Discharge Date:  12/12/2017               Expected Discharge Plan:  Skilled Nursing Facility  In-House Referral:  Clinical Social Work  Discharge planning Services  CM Consult  Status of Service:  Completed, signed off  Yancey FlemingsKimberly R Becton, RN  Nurse case manager [place] 12/09/2017, 10:29 AM

## 2017-12-09 NOTE — Progress Notes (Signed)
Inpatient Diabetes Program Recommendations  AACE/ADA: New Consensus Statement on Inpatient Glycemic Control (2015)  Target Ranges:  Prepandial:   less than 140 mg/dL      Peak postprandial:   less than 180 mg/dL (1-2 hours)      Critically ill patients:  140 - 180 mg/dL   No results found for: GLUCAP, HGBA1C  Review of Glycemic Control Results for Vernon PreyGRUBBS, Elna (MRN [id number]) as of 12/09/2017 11:03  Ref. Range 12/09/2017 04:09  Glucose Latest Ref Range: 65 - 99 mg/dL 782263 (H)   Diabetes history: no DM hx noted Outpatient Diabetes medications: none Current orders for Inpatient glycemic control: none  Inpatient Diabetes Program Recommendations:    No A1C recorded, may want to consider A1C?  Only have two recorded BS that were >180 mg/dL. In the setting of sepsis, would recommend placing patient on glycemic control order set and have routine CBG monitoring and adding Novolog 0-9 units TIDAC.  Thanks, Lujean RaveLauren Mayvis Agudelo, MSN, RNC-OB Diabetes Coordinator [phone] (8a-5p)

## 2017-12-10 DIAGNOSIS — R479 Unspecified speech disturbances: Secondary | ICD-10-CM

## 2017-12-10 DIAGNOSIS — Z66 Do not resuscitate: Secondary | ICD-10-CM

## 2017-12-10 DIAGNOSIS — L8990 Pressure ulcer of unspecified site, unspecified stage: Secondary | ICD-10-CM | POA: Diagnosis present

## 2017-12-10 DIAGNOSIS — R1310 Dysphagia, unspecified: Secondary | ICD-10-CM

## 2017-12-10 DIAGNOSIS — F0151 Vascular dementia with behavioral disturbance: Secondary | ICD-10-CM

## 2017-12-10 LAB — CBC
HCT: 29.4 % — ABNORMAL LOW (ref 36.0–46.0)
Hemoglobin: 9.2 g/dL — ABNORMAL LOW (ref 12.0–15.0)
MCH: 31 pg (ref 26.0–34.0)
MCHC: 31.3 g/dL (ref 30.0–36.0)
MCV: 99 fL (ref 78.0–100.0)
PLATELETS: 508 10*3/uL — AB (ref 150–400)
RBC: 2.97 MIL/uL — ABNORMAL LOW (ref 3.87–5.11)
RDW: 14.8 % (ref 11.5–15.5)
WBC: 22.4 10*3/uL — AB (ref 4.0–10.5)

## 2017-12-10 LAB — GLUCOSE, CAPILLARY: Glucose-Capillary: 123 mg/dL — ABNORMAL HIGH (ref 65–99)

## 2017-12-10 MED ORDER — FLUOXETINE HCL 10 MG PO CAPS: 10.0000 mg | ORAL_CAPSULE | Freq: Every day | ORAL | Status: DC

## 2017-12-10 MED ORDER — FLUOXETINE HCL 20 MG/5ML PO SOLN
10.0000 mg | Freq: Every day | ORAL | Status: DC
  Administered 2017-12-11 – 2017-12-13 (×3): 10 mg via ORAL
  Filled 2017-12-10 (×3): qty 5

## 2017-12-10 NOTE — Progress Notes (Signed)
   Subjective: Kristy Cruz was seen resting in her bed this morning. She remains nonverbal, but does not appear to be in any distress or pain. Nursing report that she spits out her pill when given.   Objective:  Vital signs in last 24 hours: Vitals:   12/09/17 0312 12/09/17 0939 12/09/17 2027 12/10/17 0547  BP: (!) 144/72 (!) 151/80 (!) 149/75 (!) 166/83  Pulse: 86 80 81 68  Resp: '16 18 16 16  '$ Temp: (!) 97.5 F (36.4 C) (!) 97.5 F (36.4 C) 98.3 F (36.8 C) 98.4 F (36.9 C)  TempSrc: Oral Oral Oral Oral  SpO2: 99% 97%  98%  Weight: 107 lb 9.6 oz (48.8 kg)  107 lb 9.7 oz (48.8 kg)    Physical Exam  Constitutional: No distress.  Thin, elderly female, nonverbal  HENT:  Head: Normocephalic and atraumatic.  Cardiovascular: Normal rate, regular rhythm and intact distal pulses.  Murmur heard. Pulmonary/Chest: Effort normal and breath sounds normal. No respiratory distress.  Abdominal: Soft. Bowel sounds are normal. She exhibits no distension. There is no tenderness.  Musculoskeletal: She exhibits no edema or deformity.  Skin: Skin is warm and dry.   Assessment/Plan: 84 yo with PMH of HTN, vascular dementia, and paranoid schizophrenia who presents with increased work of breathing noticed by staff at patient's skilled nursing facility.   Sepsis: Resolved Pneumonia: Improving. Patient presented increased work of breathing, WBC 25, Lactate 4.32, Tachycardia and RML/RLL infiltrate on CXR. She was started on ceftriaxone, azithromycin for suspected pneumonia and IVF per sepsis protocol as she met criteria for this. Some concern for possible aspiration pneumonia as patent has history of dementia, is nonverbal, and lives at a facility. > Afebrile, Leukocytosis trend 25.2>>21.6>>22.4 > Lactate trended down with IVF: 4.43>>5.12>>3.7>>2.1 > Blood Cx x2: NGTD; U/A: Mod Hgb, 50 Glu, 0-5 Squam > Strep ur Ag: Negative; Flu Panel: Negative - Ceftriaxone and Azithromycin (Day 3) - CBC in  AM  Vascular dementia: Reportedly history of vascular dementia with behavioral disturbance and paranoid schizophrenia per nursing home MAR. Patient's only medication is daily fluoxetine.  > Nursing reports patient has been spitting out her medication - Delirium precautions - Fluoxetine '10mg'$  Daily  HTN: BP elevated today to 150s-170s Sys. Patient not on anti-hypertensives per MAR. - Discontinue IVFs - Continue to monitor  FEN: Dys 1 VTE Prophylaxis: Lovenox Code Status: DNR (confirmed by transport paperwork on admission)  Dispo: Anticipated discharge in approximately 1-2 day(s).   Neva Seat, MD 12/10/2017, 7:46 AM Pager: [phone]

## 2017-12-10 NOTE — Progress Notes (Signed)
  Date: 12/10/2017  Patient name: Kristy Cruz  Medical record number: [id number]  Date of birth: Aug 18, 1929   I have seen and evaluated this patient and I have discussed the plan of care with the house staff. Please see their note for complete details. I concur with their findings with the following additions/corrections:   84 yo woman with advanced dementia (nonverbal) admitted for pneumonia. Afebrile since admission, respiratory status improved somewhat but still requiring O2 at 2L [place]. WBC remains elevated at 22. Initial sepsis with elevated lactate has resolved after fluid resuscitation, resumed normal diet. Based on location in RML/RLL, concern for aspiration with advanced dementia, but no role for swallow study as aspiration risk would continue even if NPO. If improvement stalls, will consider switch to ampicillin/sulbactam for better anaerobic coverage.   Jessy OtoAlexander Raines, M.D., Ph.D. 12/10/2017, 3:04 PM

## 2017-12-11 DIAGNOSIS — F03C0 Unspecified dementia, severe, without behavioral disturbance, psychotic disturbance, mood disturbance, and anxiety: Secondary | ICD-10-CM | POA: Diagnosis present

## 2017-12-11 DIAGNOSIS — F0390 Unspecified dementia without behavioral disturbance: Secondary | ICD-10-CM | POA: Diagnosis present

## 2017-12-11 LAB — CBC
HCT: 34.5 % — ABNORMAL LOW (ref 36.0–46.0)
Hemoglobin: 10.7 g/dL — ABNORMAL LOW (ref 12.0–15.0)
MCH: 31.1 pg (ref 26.0–34.0)
MCHC: 31 g/dL (ref 30.0–36.0)
MCV: 100.3 fL — ABNORMAL HIGH (ref 78.0–100.0)
Platelets: 555 10*3/uL — ABNORMAL HIGH (ref 150–400)
RBC: 3.44 MIL/uL — ABNORMAL LOW (ref 3.87–5.11)
RDW: 15 % (ref 11.5–15.5)
WBC: 15 10*3/uL — ABNORMAL HIGH (ref 4.0–10.5)

## 2017-12-11 LAB — GLUCOSE, CAPILLARY: GLUCOSE-CAPILLARY: 121 mg/dL — AB (ref 65–99)

## 2017-12-11 MED ORDER — LISINOPRIL 10 MG PO TABS
10.0000 mg | ORAL_TABLET | Freq: Every day | ORAL | Status: DC
  Administered 2017-12-11 – 2017-12-13 (×3): 10 mg via ORAL
  Filled 2017-12-11 (×3): qty 1

## 2017-12-11 MED ORDER — HYDRALAZINE HCL 20 MG/ML IJ SOLN: 20.0000 mg | Freq: Four times a day (QID) | INTRAMUSCULAR | Status: DC | PRN

## 2017-12-11 MED ORDER — HYDRALAZINE HCL 20 MG/ML IJ SOLN
2.0000 mg | Freq: Once | INTRAMUSCULAR | Status: AC
  Administered 2017-12-11: 2 mg via INTRAVENOUS
  Filled 2017-12-11: qty 1

## 2017-12-11 MED ORDER — HYDRALAZINE HCL 20 MG/ML IJ SOLN: 5.0000 mg | Freq: Four times a day (QID) | INTRAMUSCULAR | Status: DC | PRN

## 2017-12-11 NOTE — Progress Notes (Signed)
  Date: 12/11/2017  Patient name: Kristy Cruz  Medical record number: [id number]  Date of birth: 1929/07/06   I have seen and evaluated this patient and I have discussed the plan of care with the house staff. Please see their note for complete details. I concur with their findings with the following additions/corrections:   Slowly improving, still requiring O2 for her right-sided CAP. Afebrile, WBC decreasing. Hopeful for discharge tomorrow if able to wean oxygen. On day 4 of 5 planned for antibiotics.   Jessy OtoAlexander Raines, M.D., Ph.D. 12/11/2017, 4:48 PM

## 2017-12-11 NOTE — Discharge Summary (Addendum)
Name: Kristy Cruz MRN: [id number] DOB: 08/01/1929 84 y.o. PCP: Janifer Adie, MD  Date of Admission: 12/08/2017  8:45 PM Date of Discharge: 12/13/2017 Attending Physician: Oda Kilts, MD  Discharge Diagnosis:  Principal Problem:   Sepsis due to pneumonia [facility]) Active Problems:   Pressure injury of skin   Advanced dementia   Respiratory insufficiency   Hypertension  Discharge Medications: Allergies as of 12/13/2017      Reactions   Sulfa Drugs Cross Reactors Anxiety   Sulfa Antibiotics Other (See Comments)   Listed on [facility] 12/08/17      Medication List    TAKE these medications   acetaminophen 650 MG CR tablet Commonly known as:  TYLENOL Take 650 mg by mouth 3 (three) times daily.   barrier cream Crea Commonly known as:  non-specified Apply 1 application topically See admin instructions. Apply Barrier Cream ([facility] provides) four times daily to inner and outer buttocks and as needed with any incontinence care to denuded areas (continue to off load pressure every 1-2 hours)   eucerin cream Apply 1 application topically 2 (two) times daily. Apply to lower legs   CRITIC-AID THICK MOIST BARRIER Pste Apply 1 application topically See admin instructions. Apply topically to bottom every day and night shift for skin protectant and after each incontinence episode   fluocinonide cream 0.05 % Commonly known as:  LIDEX Apply 1 application topically See admin instructions. Apply topically to legs and behind ears every 6 hours as needed for itching   FLUoxetine 10 MG tablet Commonly known as:  PROZAC Take 10 mg by mouth daily.   food thickener Powd Commonly known as:  RESOURCE THICKENUP CLEAR Take 120 g by mouth as needed (to thicken liquids to nectar consistancy).   lisinopril 10 MG tablet Commonly known as:  PRINIVIL,ZESTRIL Take 1 tablet (10 mg total) by mouth daily.   NUTRITIONAL SUPPLEMENT PO Take 240 mLs by mouth daily. "Mighty shake"   polyethylene  glycol packet Commonly known as:  MIRALAX / GLYCOLAX Take 17 g by mouth every 8 (eight) hours as needed (constipation).   senna-docusate 8.6-50 MG tablet Commonly known as:  Senokot-S Take 1 tablet by mouth at bedtime.            Durable Medical Equipment  (From admission, onward)        Start     Ordered   12/12/17 1154  For home use only DME oxygen  Once    Question Answer Comment  Mode or (Route) Nasal cannula   Liters per Minute 2   Frequency Continuous (stationary and portable oxygen unit needed)   Oxygen conserving device Yes   Oxygen delivery system Gas      12/12/17 1155      Disposition and follow-up:   Ms.Kristy Cruz was discharged from [facility] in [place] condition.  At the hospital follow up visit please address:  Dysphagia: Respiratory insufficiency Pneumonia: - Symptoms resolved with IV Antibiotics - At risk for possible recurrence with dysphagia - Repeat Swallow study performed: Recommend continue Dysphagia 1 diet, change liquids to nectary consistency; continue speech language therapy - Patient with new oxygen demand of 2L on discharge, wean as tolerated - Follow up Chest Xray in 1 month, to evaluate for resolution  Vascular dementia: - Evaluate need for Fluoxetine and wean if no longer indicated  HTN: - BP elevated to 170s-190s following IVF resuscitation - Started on Lisinopril 73m Daily, Improved to 1449Qsystolic - Continue to monitor,  adjust medication as needed - BMP in 1-2 Weeks  2.  Labs / imaging needed at time of follow-up: BMP, Chest Xray in 1 Month  3.  Pending labs/ test needing follow-up: None  Follow-up Appointments: [place] Follow up.   Why:  Will arrange for oxygen to be delivered to patient room prior to discharge. Contact information: [place] [address] [address] Hospital Course by problem list:    Sepsis Dysphagia Respiratory insufficiency Pneumonia: Patient presented increased work of breathing, WBC 25, Lactate 4.32, Tachycardia and RML/RLL infiltrate on CXR. She was started on ceftriaxone, azithromycin for suspected pneumonia and IVF per sepsis protocol as she met criteria for this. Lactate improved with IVF and these were discontinued once her diet was started. Patient's symptoms resolved with IV antibiotic therapy. Blood cultures showed no growth at 3 days and patient remained afebrile throughout her hospitalization. There was some concern for aspiration etiology as patent has history of dementia, is nonverbal, and lives at a facility. Speech Language Pathology  Performed modified barium swallow and recommend liquids thickened to nectar consistency and continue puree (D1) texture, full supervision, straws allowed, crushed pills and continued ST.  Vascular dementia: Reported history of vascular dementia with behavioral disturbance and paranoid schizophrenia per nursing home MAR. Patient's only medication was daily fluoxetine. Family states that the diagnosis of dementia is accurate, but diagnosis of schizophrenia is not. Now unclear as to why patients is prescribed fluoxetine, but do not what to abruptly withdrawal this medication as she was taking it at her facility.  ZSM:OLMBE pressure initially low with concern for sepsis. Blood pressure improved with IV Fluids and patient became hyper tensive once replete. She was started on Lisinopril 65m Daily and discharged on the same with Systolic BP in 1675Q Will need for monitoring and titration after discharge.  Discharge Vitals:   BP 136/63 (BP Location: Left Arm)   Pulse 77   Temp 98.1 F (36.7 C)   Resp 16   Ht _0  (1.549 m)   Wt 107 lb 9.4 oz (48.8 kg)   SpO2 94%   BMI 20.33 kg/m   Pertinent Labs, Studies, and Procedures:  CBC Latest Ref Rng & Units 12/12/2017 12/11/2017 12/10/2017  WBC 4.0 - 10.5 K/uL 13.5(H) 15.0(H) 22.4(H)   Hemoglobin 12.0 - 15.0 g/dL 10.2(L) 10.7(L) 9.2(L)  Hematocrit 36.0 - 46.0 % 32.7(L) 34.5(L) 29.4(L)  Platelets 150 - 400 K/uL 499(H) 555(H) 508(H)   BMP Latest Ref Rng & Units 12/09/2017 12/08/2017 12/27/2015  Glucose 65 - 99 mg/dL 263(H) 229(H) 96  BUN 6 - 20 mg/dL 15 20 22(H)  Creatinine 0.44 - 1.00 mg/dL 0.69 0.75 0.80  Sodium 135 - 145 mmol/L 142 138 142  Potassium 3.5 - 5.1 mmol/L 3.8 3.4(L) 4.1  Chloride 101 - 111 mmol/L 109 100(L) 103  CO2 22 - 32 mmol/L 22 26 -  Calcium 8.9 - 10.3 mg/dL 8.1(L) 8.7(L) -   Chest Xray (portable): IMPRESSION: 1. Interstitial and alveolar opacity in the right mid to lower lung, may reflect pneumonia 2. There is cardiomegaly and mild vascular congestion.  Modified Barium Swallow: Clinical Impression Mild decreased oral cohesion, decreased bolus formation and delayed transit characterize oral phase of swallow. Laryngeal penetration to vocal cords elicited a cough response effective to remove majority of thin barium. Cognitive impairments prevent use attempts at compensation for phrygneal impairments. Swallow timing is  discoordinated and initiated late at the pyriform sinuses without significant pharyngeal residue. Brief esophageal scan did not reveal concern for further recommendations or work up. Given objective findings and reports of dysphagia with thin, recommend liquids thickened to nectar consistency and continue puree (D1) texture, full supervision, straws allowed, crushed pills and continued ST.  Discharge Instructions: Discharge Instructions    Call MD for:  difficulty breathing, headache or visual disturbances   Complete by:  As directed    Call MD for:  persistant dizziness or light-headedness   Complete by:  As directed    Call MD for:  temperature >100.4   Complete by:  As directed    Discharge instructions   Complete by:  As directed    Thank you for allowing [place] to care for you  Your symptoms were due to a pneumonia. - You have been  found to require oxygen and are being discharged with oxygen therapy  You were noted to have high blood pressure - You were started on Lisinopril 34m daily  Diet going forward will be dysphagia ! With honey thickened liquids      Signed: MNeva Seat MD 12/13/2017, 2:24 PM   Pager: [phone]

## 2017-12-11 NOTE — Progress Notes (Signed)
Subjective: Kristy Cruz was seen in her bed this morning. She remain non verbal and largely non interactive with [place]. Did not appear to be in distress on examination. Spoke with daughter at bedside later in the morning and updated her on patient's condition and treatment. Daughter stated that her mother had seemed to have been having continued difficulty swallowing despite being on a dysphagia diet and was wondering if a repeat swallow evaluation would be appropriate. It was discussed that it would be appropriate and that her and her family should be prepared that if the results show significant worsening in her mothers ability to swallow they may have to decide about facing the risks and benefits of feeding tube versus allowing for a degree of risk of aspiration, but maintaining quality of life with oral feedings. Daughter had no further questions at this time.  Objective:  Vital signs in last 24 hours: Vitals:   12/10/17 2110 12/11/17 0550 12/11/17 0616 12/11/17 0933  BP: (!) 179/89 (!) 194/91 (!) 194/80 (!) 173/77  Pulse: 94 73 72 86  Resp: '20 15  18  ' Temp: 98.4 F (36.9 C) 98 F (36.7 C)  99 F (37.2 C)  TempSrc: Oral   Oral  SpO2: 91% 94%  93%  Weight: 107 lb 9.8 oz (48.8 kg)      Physical Exam  Constitutional: No distress.  Thin, elderly female, nonverbal  HENT:  Head: Normocephalic and atraumatic.  Cardiovascular: Normal rate, regular rhythm and intact distal pulses.  Systolic murmur  Pulmonary/Chest: Effort normal and breath sounds normal. No respiratory distress.  Decreased respiratory effort  Abdominal: Soft. Bowel sounds are normal. She exhibits no distension. There is no tenderness.  Musculoskeletal: She exhibits no edema or deformity.  Skin: Skin is warm and dry.   Assessment/Plan: 84 yo with PMH of HTN, vascular dementia, and paranoid schizophrenia who presents with increased work of breathing noticed by staff at patient's skilled nursing facility.    Dysphagia Pneumonia: Improving. Patient presented increased work of breathing, WBC 25, Lactate 4.32, Tachycardia and RML/RLL infiltrate on CXR. She was started on ceftriaxone, azithromycin for suspected pneumonia and IVF per sepsis protocol as she met criteria for this. Some concern for possible aspiration pneumonia as patent has history of dementia, is nonverbal, and lives at a facility. Lactate downtrended with IVFs. Concern for possible aspiration pneumonia due to patients history of dysphagia and dementia. Family would like to have repeat swallow evaluation as they feel she has been having difficulty swallowing, which appears per conversation with [facility] provider to have last been performed about 1 year ago; Repeating swallow evaluation is a reasonable course of action. > Afebrile, Leukocytosis trending down, 15.0 today (25.2 on Admission) > Blood Cx x2: NGTD; U/A: Mod Hgb, 50 Glu, 0-5 Squam > Strep ur Ag: Negative; Flu Panel: Negative - Wean Oxygen as tolerated, continues to require 1-2L (not on O2 at facility) - Ceftriaxone and Azithromycin (Day 4) - SLP Eval and Treat - CBC in AM  Vascular dementia: Reported history of vascular dementia with behavioral disturbance and paranoid schizophrenia per nursing home MAR. Patient's only medication is daily fluoxetine. Family states that the diagnosis of dementia is accurate, but diagnosis of schizophrenia is not. Now unclear as to why patients is prescribed fluoxetine, but do not what to abruptly withdrawal this medication as she was taking it at her facility. - Delirium precautions - Fluoxetine 30m Daily  HTN: BP >>944Systolic overnight and patient given PRN hydralazine. BP in 170s today. -  Start Lisinopril 64m Daily - Hydralazine 563mPRN, Sys >180 or Dia >100 - Continue to monitor  FEN: Dys 1 VTE Prophylaxis: Lovenox Code Status: DNR (confirmed by transport paperwork on admission)  Dispo: Anticipated discharge in approximately 1-2 day(s).    MeNeva SeatMD 12/11/2017, 1:20 PM Pager: [phone]

## 2017-12-11 NOTE — Progress Notes (Signed)
Notified MD of pt b/p-194/91. Awaiting orders. Will continue to monitor.

## 2017-12-12 ENCOUNTER — Other Ambulatory Visit: Payer: Self-pay

## 2017-12-12 ENCOUNTER — Inpatient Hospital Stay (HOSPITAL_COMMUNITY): Payer: Medicare (Managed Care)

## 2017-12-12 ENCOUNTER — Encounter (HOSPITAL_COMMUNITY): Payer: Self-pay | Admitting: *Deleted

## 2017-12-12 DIAGNOSIS — I10 Essential (primary) hypertension: Secondary | ICD-10-CM | POA: Diagnosis present

## 2017-12-12 DIAGNOSIS — R0689 Other abnormalities of breathing: Secondary | ICD-10-CM | POA: Diagnosis present

## 2017-12-12 LAB — CBC
HCT: 32.7 % — ABNORMAL LOW (ref 36.0–46.0)
Hemoglobin: 10.2 g/dL — ABNORMAL LOW (ref 12.0–15.0)
MCH: 30.5 pg (ref 26.0–34.0)
MCHC: 31.2 g/dL (ref 30.0–36.0)
MCV: 97.9 fL (ref 78.0–100.0)
Platelets: 499 10*3/uL — ABNORMAL HIGH (ref 150–400)
RBC: 3.34 MIL/uL — ABNORMAL LOW (ref 3.87–5.11)
RDW: 14.4 % (ref 11.5–15.5)
WBC: 13.5 10*3/uL — ABNORMAL HIGH (ref 4.0–10.5)

## 2017-12-12 LAB — GLUCOSE, CAPILLARY: Glucose-Capillary: 118 mg/dL — ABNORMAL HIGH (ref 65–99)

## 2017-12-12 MED ORDER — RESOURCE THICKENUP CLEAR PO POWD
ORAL | Status: DC | PRN
  Filled 2017-12-12: qty 125

## 2017-12-12 MED ORDER — LISINOPRIL 10 MG PO TABS: 10.0000 mg | ORAL_TABLET | Freq: Every day | ORAL | Status: AC

## 2017-12-12 MED ORDER — FOOD THICKENER (THICKENUP CLEAR): 1.0000 | ORAL | Status: DC | PRN

## 2017-12-12 MED ORDER — FOOD THICKENER (THICKENUP CLEAR): 1.0000 | ORAL | Status: AC | PRN

## 2017-12-12 NOTE — NC FL2 (Addendum)
[place] MEDICAID FL2 LEVEL OF CARE SCREENING TOOL     IDENTIFICATION  Patient Name: Kristy Cruz Birthdate: May 30, 1929 Sex: female Admission Date (Current Location): 12/08/2017  [facility] [place]   Fac[facility] and Address[facility]  [facility], [address], [place], [place] [address]      Provider Number: [id number]  Attending Physician Name and Address:  Anne Shutteraines, Alexander N, MD  Relative Name and Phone Number:       Current Level of Care: Hospital Recommended Level of Care: Assisted Living Facility([facility]) Prior Approval Number:    Date Approved/Denied:   PASRR Number:    Discharge Plan: Other (Comment)([facility])    Current Diagnoses: Patient Active Problem List   Diagnosis Date Noted  . Respiratory insufficiency 12/12/2017  . Hypertension 12/12/2017  . Advanced dementia 12/11/2017  . Pressure injury of skin 12/10/2017  . Sepsis due to pneumonia (HCC) 12/09/2017    Orientation RESPIRATION BLADDER Height & Weight     Self  O2(2 Liters Oxygen)  Weight: 107 lb 9.4 oz (48.8 kg) Height:     BEHAVIORAL SYMPTOMS/MOOD NEUROLOGICAL BOWEL NUTRITION STATUS      Continent Diet(Puree diet)  AMBULATORY STATUS COMMUNICATION OF NEEDS Skin   Extensive Assist Non-Verbally Skin abrasions(Stage one pressure injury to sacrum; MASD - sacrum; left and right )                       Personal Care Assistance Level of Assistance  Bathing, Feeding, Dressing Bathing Assistance: Maximum assistance Feeding assistance: Maximum assistance Dressing Assistance: Maximum assistance     Functional Limitations Info  Sight, Hearing, Speech Sight Info: Adequate Hearing Info: Adequate Speech Info: Impaired(Non verbal and will nod to questions.)    SPECIAL CARE FACTORS FREQUENCY  Speech therapy             Speech Therapy Frequency: Swallow eval. 418 (Puree diet recommended)       Contractures Contractures Info: Not present    Additional Factors Info  Code Status, Allergies Code Status Info: DNR Allergies Info: Sulfa Drug Cross Reactors, Sulfa antibiotics            Current Medications (12/12/2017):  This is the current hospital active medication list Current Facility-Administered Medications  Medication Dose Route Frequency Provider Last Rate Last Dose  . acetaminophen (TYLENOL) tablet 650 mg  650 mg Oral Q6H PRN Arnetha CourserAmin, Sumayya, MD       Or  . acetaminophen (TYLENOL) suppository 650 mg  650 mg Rectal Q6H PRN Arnetha CourserAmin, Sumayya, MD      . azithromycin (ZITHROMAX) 500 mg in sodium chloride 0.9 % 250 mL IVPB  500 mg Intravenous Q24H Petrucelli, Samantha R, PA-C 250 mL/hr at 12/11/17 2046 500 mg at 12/11/17 2046  . cefTRIAXone (ROCEPHIN) 2 g in sodium chloride 0.9 % 100 mL IVPB  2 g Intravenous Q24H Petrucelli, Samantha R, PA-C   Stopped at 12/12/17 1548  . FLUoxetine (PROZAC) 20 MG/5ML solution 10 mg  10 mg Oral Daily Lodema Hongierce, Dwayne A, RPH   10 mg at 12/12/17 [id number]  . hydrALAZINE (APRESOLINE) injection 5 mg  5 mg Intravenous Q6H PRN Beola CordMelvin, Alexander, MD      . ipratropium-albuterol (DUONEB) 0.5-2.5 (3) MG/3ML nebulizer solution 3 mL  3 mL Nebulization Q6H PRN Tyson AliasVincent, Duncan Thomas, MD      . lisinopril (PRINIVIL,ZESTRIL) tablet 10 mg  10 mg Oral Daily Beola CordMelvin, Alexander, MD  10 mg at 12/12/17 0918  . ondansetron (ZOFRAN) tablet 4 mg  4 mg Oral Q6H PRN Arnetha Courser, MD       Or  . ondansetron (ZOFRAN) injection 4 mg  4 mg Intravenous Q6H PRN Arnetha Courser, MD      . polyethylene glycol (MIRALAX / GLYCOLAX) packet 17 g  17 g Oral Daily PRN Arnetha Courser, MD      . sodium chloride flush (NS) 0.9 % injection 3 mL  3 mL Intravenous Q12H Arnetha Courser, MD   3 mL at 12/11/17 2045     Discharge Medications: Please see discharge summary for a list of discharge medications.  Relevant Imaging Results:  Relevant Lab Results:   Additional Information ssn [id number]  - DISCHARGE MEDICATIONS Medication List           TAKE these medications          acetaminophen 650 MG CR tablet Commonly known as:  TYLENOL Take 650 mg by mouth 3 (three) times daily.   barrier cream Crea Commonly known as:  non-specified Apply 1 application topically See admin instructions. Apply Barrier Cream ([facility] provides) four times daily to inner and outer buttocks and as needed with any incontinence care to denuded areas (continue to off load pressure every 1-2 hours)   eucerin cream Apply 1 application topically 2 (two) times daily. Apply to lower legs   CRITIC-AID THICK MOIST BARRIER Pste Apply 1 application topically See admin instructions. Apply topically to bottom every day and night shift for skin protectant and after each incontinence episode   fluocinonide cream 0.05 % Commonly known as:  LIDEX Apply 1 application topically See admin instructions. Apply topically to legs and behind ears every 6 hours as needed for itching   FLUoxetine 10 MG tablet Commonly known as:  PROZAC Take 10 mg by mouth daily.   food thickener Powd Commonly known as:  RESOURCE THICKENUP CLEAR Take 120 g by mouth as needed (to thicken liquids to nectar consistancy).  lisinopril 10 MG tablet Commonly known as:  PRINIVIL,ZESTRIL Take 1 tablet (10 mg total) by mouth daily. Start taking on:  12/13/2017   NUTRITIONAL SUPPLEMENT PO Take 240 mLs by mouth daily. "Mighty shake"   polyethylene glycol packet Commonly known as:  MIRALAX / GLYCOLAX Take 17 g by mouth every 8 (eight) hours as needed (constipation).   senna-docusate 8.6-50 MG tablet Commonly known as:  Senokot-S Take 1 tablet by mouth at bedtime.       Cristobal Goldmann, LCSW

## 2017-12-12 NOTE — Progress Notes (Signed)
SATURATION QUALIFICATIONS: (This note is used to comply with regulatory documentation for home oxygen)  Patient Saturations on Room Air at Rest = 84 Patient Saturations on Room Air while Ambulating = pt is non verbal and doesn't follow commands, couldn't do ambulation.  Patient Saturations on 2Liters of oxygen = 95%  Please briefly explain why patient needs home oxygen:  Respiratory insufficiency, patient dementia and non verbal.

## 2017-12-12 NOTE — Clinical Social Work Note (Addendum)
Patient medically stable for discharge, however needs to discharge on oxygen and [facility] were [facility] for the patient to have oxygen at the ALF upon her arrival. This will be worked out on Friday so that patient can discharge to ALF. They were provided with d/c summary and FL-2. Tonya with ALF requested that the food thickener  listed in medications note the consistency of the thickener. This information was passed on to patient's nurse so that the MD can clarify consistency of the thickener. Patient's daughter, [name] contacted and updated. CSW will work with nurse case Production designer, theatre/television/filmmanager and patient's nurse regarding oxygen and d/c summary so that patient can discharge on Friday.  Genelle BalVanessa Michaelah Credeur, MSW, LCSW Licensed Clinical Social Worker Clinical Social Work Department [facility] [phone]

## 2017-12-12 NOTE — Evaluation (Signed)
Clinical/Bedside Swallow Evaluation Patient Details  Name: Kristy Cruz MRN: [id number] Date of Birth: 1929/06/07  Today's Date: 12/12/2017 Time: SLP Start Time (ACUTE ONLY): 1122 SLP Stop Time (ACUTE ONLY): 1131 SLP Time Calculation (min) (ACUTE ONLY): 9 min  Past Medical History:  Past Medical History:  Diagnosis Date  . Hypertension    Past Surgical History:  Past Surgical History:  Procedure Laterality Date  . VASCULAR SURGERY     HPI:  84 yo with PMH of HTN, vascular dementia, and paranoid schizophrenia who presents with increased work of breathing noticed by staff at patient's skilled nursing facility. CXR  Interstitial and alveolar opacity in the right mid to lower lung, may reflect pneumonia. Per MD note "Concern for possible aspiration pneumonia due to patients history of dysphagia and dementia. Repeat swallow evaluation ordered as family reported difficulty swallowing in the setting of known dysphagia on Dysphagia diet and dementia. It appears her last swallow evaluation was about 1 year ago." SLP cannot find priorST documentation in chart.   Assessment / Plan / Recommendation Clinical Impression  Pt presents with reduced oral manipulation and suspected pharyngeal dysphagia. Appeared to have prolonged laryngeal elevation, multiple swallows and discoordinated swallow and respirations. History of pna and MD documentation of familty report re: swallow difficulty. Continue current diet and MBS recommended, scheduled for this afternoon.  SLP Visit Diagnosis: Dysphagia, unspecified (R13.10)    Aspiration Risk  Moderate aspiration risk    Diet Recommendation Dysphagia 1 (Puree);Thin liquid   Liquid Administration via: Straw;Cup Medication Administration: Crushed with puree Supervision: Patient able to self feed;Full supervision/cueing for compensatory strategies Compensations: Slow rate;Small sips/bites;Minimize environmental distractions Postural Changes: Seated upright at 90  degrees    Other  Recommendations Oral Care Recommendations: Oral care BID   Follow up Recommendations Other (comment)(TBD)      Frequency and Duration            Prognosis        Swallow Study   General HPI: 84 yo with PMH of HTN, vascular dementia, and paranoid schizophrenia who presents with increased work of breathing noticed by staff at patient's skilled nursing facility. CXR  Interstitial and alveolar opacity in the right mid to lower lung, may reflect pneumonia. Per MD note "Concern for possible aspiration pneumonia due to patients history of dysphagia and dementia. Repeat swallow evaluation ordered as family reported difficulty swallowing in the setting of known dysphagia on Dysphagia diet and dementia. It appears her last swallow evaluation was about 1 year ago." SLP cannot find priorST documentation in chart. Type of Study: Bedside Swallow Evaluation Previous Swallow Assessment: (none found in [facility] system) Diet Prior to this Study: Dysphagia 1 (puree);Thin liquids Temperature Spikes Noted: No Respiratory Status: Nasal cannula History of Recent Intubation: No Behavior/Cognition: Alert;Cooperative;Pleasant mood;Distractible;Requires cueing;Confused Oral Cavity Assessment: Other (comment)(difficult to view) Oral Care Completed by SLP: No Oral Cavity - Dentition: Edentulous Vision: Functional for self-feeding Self-Feeding Abilities: Needs set up Patient Positioning: Upright in bed Baseline Vocal Quality: Normal(2 words verbalized) Volitional Cough: Cognitively unable to elicit Volitional Swallow: Unable to elicit    Oral/Motor/Sensory Function Overall Oral Motor/Sensory Function: (did not follow directions)   Ice Chips Ice chips: Not tested   Thin Liquid Thin Liquid: Impaired Presentation: Straw Oral Phase Impairments: Reduced lingual movement/coordination    Nectar Thick Nectar Thick Liquid: Not tested   Honey Thick Honey Thick Liquid: Not tested   Puree Puree:  Impaired Oral Phase Impairments: Reduced lingual movement/coordination   Solid   GO  Solid: Not tested        Kristy Cruz, Kristy Cruz 12/12/2017,1:37 PM Kristy Cruz M.Ed [employer] Pager [phone]

## 2017-12-12 NOTE — Progress Notes (Addendum)
[facility] "Lupita LeashDonna" is unable to process request for DME: oxygen since patient payor is [facility].  States [facility] is required to set up their own oxygen for patients, they do their own qualifying sat's.  NCM called left message for Leighton RuffJanet Pennell at [phone], transferred to voicemail for LCSW for [facility] and advised of process per In house liaison for DME with [facility] and left NCM contact information. And called [phone] was transferred and left message for [facility] with [facility].  Cloyde ReamsKimberly Becton Nurse case Set designermanager  [place]

## 2017-12-12 NOTE — Progress Notes (Signed)
  Speech Language Pathology  Patient Details Name: Kristy Cruz MRN: [id number] DOB: 23-Nov-1928 Today's Date: 12/12/2017 Time: [id number] SLP Time Calculation (min) (ACUTE ONLY): 9 min       Full swallow assessment to be documented. Pt needs objective swallow assessment. MBS planned for today at 1400                                       Royce MacadamiaLitaker, Christia Coaxum Willis 12/12/2017, 11:38 AM  Breck CoonsLisa Willis Lonell FaceLitaker M.Ed [employer]SLP Pager [phone]

## 2017-12-12 NOTE — Progress Notes (Signed)
Modified Barium Swallow Progress Note  Patient Details  Name: Kristy Cruz MRN: [id number] Date of Birth: 12/01/1928  Today's Date: 12/12/2017  Modified Barium Swallow completed.  Full report located under Chart Review in the Imaging Section.  Brief recommendations include the following:  Clinical Impression  Mild decreased oral cohesion, decreased bolus formation and delayed transit characterize oral phase of swallow. Laryngeal penetration to vocal cords elicited a cough response effective to remove majority of thin barium. Cognitive impairments prevent use attempts at compensation for phrygneal impairments. Swallow timing is discoordinated and initiated late at the pyriform sinuses without significant pharyngeal residue. Brief esophageal scan did not reveal concern for further recommendations or work up. Given objective findings and reports of dysphagia with thin, recommend liquids thickened to nectar consistency and continue puree (D1) texture, full supervision, straws allowed, crushed pills and continued ST.   Swallow Evaluation Recommendations       SLP Diet Recommendations: Dysphagia 1 (Puree) solids;Nectar thick liquid   Liquid Administration via: Cup;Straw   Medication Administration: Crushed with puree   Supervision: Patient able to self feed;Full supervision/cueing for compensatory strategies   Compensations: Slow rate;Small sips/bites;Minimize environmental distractions   Postural Changes: Seated upright at 90 degrees   Oral Care Recommendations: Oral care BID   Other Recommendations: Order thickener from pharmacy    Kristy Cruz, Kristy Cruz 12/12/2017,2:52 PM  lumbosacral

## 2017-12-12 NOTE — Evaluation (Signed)
SLP Cancellation Note  Patient Details Name: Kristy Cruz MRN: [id number] DOB: [birth year]   Cancelled treatment:       Reason Eval/Treat Not Completed: Other (comment)(pt currently sleeping, did not arouse adequately for po/eval, no family present; will continue efforts, if pt to dc- SLP eval may be ordered as an OP also, thanks)  Kristy Burnetamara Nafisa Olds, MS [facility] SLP [phone]  Chales AbrahamsKimball, Kristy Hoefle Cruz 12/12/2017, 7:32 AM

## 2017-12-12 NOTE — Progress Notes (Addendum)
  Date: 12/12/2017  Patient name: Kristy Cruz  Medical record number: [id number]  Date of birth: 1929-03-02   I have seen and evaluated this patient and I have discussed the plan of care with the house staff. Please see their note for complete details. I concur with their findings with the following additions/corrections:   Slowly improving from pneumonia, still afebrile. Unfortunately, still requiring oxygen, but all other signs point to adequate treatment of pneumonia (RR, WBC improving). Likely part of slow recovery as expected in extremely frail older woman with advanced dementia. Has completed 5 day course of antibiotics. Based on swallow study today, recommending nectar thick liquids. Ready for discharge back to facility.  Jessy OtoAlexander Raines, M.D., Ph.D. 12/12/2017, 5:48 PM  ADDENDUM: For documentation purposes, please note she has a sacral pressure ulcer that was present on admission.

## 2017-12-12 NOTE — Progress Notes (Signed)
   Subjective: Mrs Ureste was seen in her bed this morning. She remains nonverbal. She does not appear to be distress and is breathing comfortably.  Objective:  Vital signs in last 24 hours: Vitals:   12/11/17 0933 12/11/17 1844 12/11/17 2049 12/12/17 0623  BP: (!) 173/77 139/81 (!) 173/77 (!) 168/70  Pulse: 86 88 85 80  Resp: 18 15 18 18  Temp: 99 F (37.2 C) 99.2 F (37.3 C) 98.2 F (36.8 C) 98 F (36.7 C)  TempSrc: Oral Oral Oral Oral  SpO2: 93% 94% 95% 95%  Weight:   107 lb 9.4 oz (48.8 kg)    Physical Exam  Constitutional: No distress.  Thin, nonverbal, elderly female  HENT:  Head: Normocephalic and atraumatic.  Cardiovascular: Normal rate, regular rhythm and intact distal pulses.  Systolic murmur  Pulmonary/Chest: Effort normal and breath sounds normal. No respiratory distress.  Auscultation limited due to, poor respiratory effort  Abdominal: Soft. Bowel sounds are normal. She exhibits no distension. There is no tenderness.  Musculoskeletal: She exhibits no edema or deformity.  Skin: Skin is warm and dry.   Assessment/Plan: 84 yo with PMH of HTN, vascular dementia, and paranoid schizophrenia who presents with increased work of breathing noticed by staff at patient's skilled nursing facility.   Dysphagia Pneumonia: Improved. Patient presented increased work of breathing, WBC 25, Lactate 4.32, Tachycardia and RML/RLL infiltrate on CXR. She was started on ceftriaxone & azithromycin for suspected pneumonia and IVF per sepsis protocol as she met criteria for this. Lactate downtrended with IVFs. Concern for possible aspiration pneumonia due to patients history of dysphagia and dementia. Repeat swallow evaluation ordered as family reported difficulty swallowing in the setting of known dysphagia on Dysphagia diet and dementia. It appears her last swallow evaluation was about 1 year ago. > Afebrile, Leukocytosis trending down, 13.5 today (25.2 on Admission) > Blood Cx x2: NG x3d;  U/A: Mod Hgb, 50 Glu, 0-5 Squam > Strep ur Ag: Negative; Flu Panel: Negative - Wean Oxygen as tolerated, continues to require 2L (not on O2 at facility previously) - Ceftriaxone (Day 5 of 5), Completed 4 days Azithromycin (with half life of 3 days a good clinical response 4 days is adequate) - SLP Eval and Treat  Vascular dementia: Stable. Reported history of vascular dementia with behavioral disturbance and paranoid schizophrenia per nursing home MAR. Patient's only medication is daily fluoxetine. Family states that the diagnosis of dementia is accurate, but diagnosis of schizophrenia is not. Now unclear as to why patients is prescribed fluoxetine, but do not what to abruptly withdrawal this medication as she was taking it at her facility. - Delirium precautions - Fluoxetine 10mg Daily  HTN: BP in 160s Systolic today.  - Lisinopril 10mg Daily - Hydralazine 5mg PRN, Sys >180 or Dia >100 - Continue to monitor  FEN: Dys 1 VTE Prophylaxis: Lovenox Code Status: DNR (confirmed by transport paperwork on admission)  Dispo: Anticipated discharge later today.  Melvin, Alexander, MD 12/12/2017, 6:39 AM Pager: [phone]  

## 2017-12-13 ENCOUNTER — Encounter (HOSPITAL_COMMUNITY): Payer: Self-pay

## 2017-12-13 DIAGNOSIS — R0689 Other abnormalities of breathing: Secondary | ICD-10-CM

## 2017-12-13 LAB — CULTURE, BLOOD (ROUTINE X 2)
Culture: NO GROWTH
Culture: NO GROWTH
Special Requests: ADEQUATE
Special Requests: ADEQUATE

## 2017-12-13 LAB — GLUCOSE, CAPILLARY: GLUCOSE-CAPILLARY: 100 mg/dL — AB (ref 65–99)

## 2017-12-13 NOTE — Progress Notes (Signed)
  Speech Language Pathology Treatment: Dysphagia  Patient Details Name: Kristy Cruz MRN: [id number] DOB: Mar 10, 1929 Today's Date: 12/13/2017 Time: 2257-5051 SLP Time Calculation (min) (ACUTE ONLY): 8 min  Assessment / Plan / Recommendation Clinical Impression  Pt consumed orange juice thickened to nectar consistency by this therapist. Prolonged oral transit and appearance of holding without s/s aspiration. Pt's severity of dementia prevents direct education and no family present. No oral residue. Pt scheduled to discharge to ALF. Recommend continue puree diet and nectar thick liquids, crush pills, straws allowed and follow up ST at next venue of care. Will sign off.     HPI HPI: 84 yo with PMH of HTN, vascular dementia, and paranoid schizophrenia who presents with increased work of breathing noticed by staff at patient's skilled nursing facility. CXR  Interstitial and alveolar opacity in the right mid to lower lung, may reflect pneumonia. Per MD note "Concern for possible aspiration pneumonia due to patients history of dysphagia and dementia. Repeat swallow evaluation ordered as family reported difficulty swallowing in the setting of known dysphagia on Dysphagia diet and dementia. It appears her last swallow evaluation was about 1 year ago." SLP cannot find priorST documentation in chart.      SLP Plan  All goals met;Discharge SLP treatment due to (comment)       Recommendations  Diet recommendations: Dysphagia 1 (puree);Nectar-thick liquid Liquids provided via: Cup;Straw Medication Administration: Crushed with puree Supervision: Full supervision/cueing for compensatory strategies;Patient able to self feed Compensations: Minimize environmental distractions;Slow rate;Small sips/bites Postural Changes and/or Swallow Maneuvers: Seated upright 90 degrees                Oral Care Recommendations: Oral care BID Follow up Recommendations: 24 hour supervision/assistance SLP Visit  Diagnosis: Dysphagia, oropharyngeal phase (R13.12) Plan: All goals met;Discharge SLP treatment due to (comment)                       Houston Siren 12/13/2017, 10:09 AM  Orbie Pyo Colvin Caroli.Ed [employer] [phone]

## 2017-12-13 NOTE — Progress Notes (Signed)
[facility]. VSS, IV removed, Telemetry removed.  [facility].

## 2017-12-13 NOTE — Care Management Important Message (Signed)
Important Message  Patient Details  Name: Kristy Cruz MRN: [id number] Date of Birth: 02-May-1929   Medicare Important Message Given:  No Due to illness patient not able to sight/Unsigned copy let   Dorena BodoIris Levar Fayson 12/13/2017, 2:25 PM

## 2017-12-13 NOTE — Progress Notes (Signed)
   Subjective: [name] was seen resting in her bed. She remain non verbal. She appears more alert today. She does not appear to be in any discomfort.  Objective:  Vital signs in last 24 hours: Vitals:   12/12/17 0947 12/12/17 1825 12/12/17 2035 12/13/17 0612  BP: (!) 149/74 (!) 155/51 140/79 140/69  Pulse: 82 90 85 78  Resp: 16 17 16    Temp: 98.9 F (37.2 C) 98.2 F (36.8 C) 98.8 F (37.1 C)   TempSrc: Oral Oral Oral   SpO2: (!) 89% 91% 90% (!) 86%  Weight:   107 lb 9.4 oz (48.8 kg)    Physical Exam  Constitutional: No distress.  Thin, nonverbal, elderly female  HENT:  Head: Normocephalic and atraumatic.  Cardiovascular: Normal rate, regular rhythm and intact distal pulses.  Systolic murmur  Pulmonary/Chest: Effort normal and breath sounds normal. No respiratory distress.  Auscultation limited due to poor respiratory effort  Abdominal: Soft. Bowel sounds are normal. She exhibits no distension. There is no tenderness.  Musculoskeletal: She exhibits no edema or deformity.  Skin: Skin is warm and dry.   Assessment/Plan: 84 yo with PMH of HTN, vascular dementia, and paranoid schizophrenia who presents with increased work of breathing noticed by staff at patient's skilled nursing facility.   Dysphagia Pneumonia: Improved. Patient presented increased work of breathing, WBC 25, Lactate 4.32, Tachycardia and RML/RLL infiltrate on CXR. Lactate downtrended with IVFs. Completed course of ceftriaxone and azithromycin. Possible aspiration. Repeat swallow evaluation ordered as family reported difficulty swallowing in the setting of known dysphagia on Dysphagia diet and dementia. > Afebrile, Leukocytosis trended down > Blood Cx x2: No Growth in 4d; U/A: Mod Hgb, 50 Glu, 0-5 Squam > Strep ur Ag: Negative; Flu Panel: Negative - Wean Oxygen as tolerated, continues to require 2L - Dysphagia 1 diet with nectar thick liquids  Vascular dementia: Stable. Reported history of vascular dementia  with behavioral disturbance and paranoid schizophrenia per nursing home MAR. Patient's only medication is daily fluoxetine. Family states that the diagnosis of dementia is accurate, but diagnosis of schizophrenia is not. Now unclear as to why patients is prescribed fluoxetine, but do not what to abruptly withdrawal this medication as she was taking it at her facility. - Delirium precautions - Fluoxetine 10mg  Daily  HTN: BP in 140s Systolic today.  - Lisinopril 10mg  Daily - Continue to monitor  FEN: Dys 1, nectar thick liquids VTE Prophylaxis: Lovenox Code Status: DNR (confirmed by transport paperwork on admission)  Dispo: Anticipated discharge today.  Kristy Cruz, Alexander, MD 12/13/2017, 6:56 AM Pager: [phone]

## 2017-12-13 NOTE — Clinical Social Work Note (Signed)
[facility] Discharge clinicals transmitted to facility and daughter contacted and informed of discharge and ambulance transport. CSW signing off, however please reconsult in any SW intervention services needed prior to discharge.  Genelle BalVanessa Tanaya Dunigan, MSW, LCSW Licensed Clinical Social Worker Clinical Social Work Department [facility] [phone]

## 2018-03-04 ENCOUNTER — Encounter: Payer: Self-pay | Admitting: Internal Medicine

## 2018-03-04 NOTE — Progress Notes (Signed)
Opened in error

## 2018-03-17 NOTE — Progress Notes (Signed)
This encounter was created in error - please disregard.

## 2018-04-27 DEATH — deceased

## 2020-03-06 IMAGING — DX DG CHEST 1V PORT
2 series · 2 of 2 positions shown · non-contrast
Comparison: 06/04/2017

CLINICAL DATA: Short of breath

EXAM:
PORTABLE CHEST 1 VIEW

[chest ap (1 of 2)]
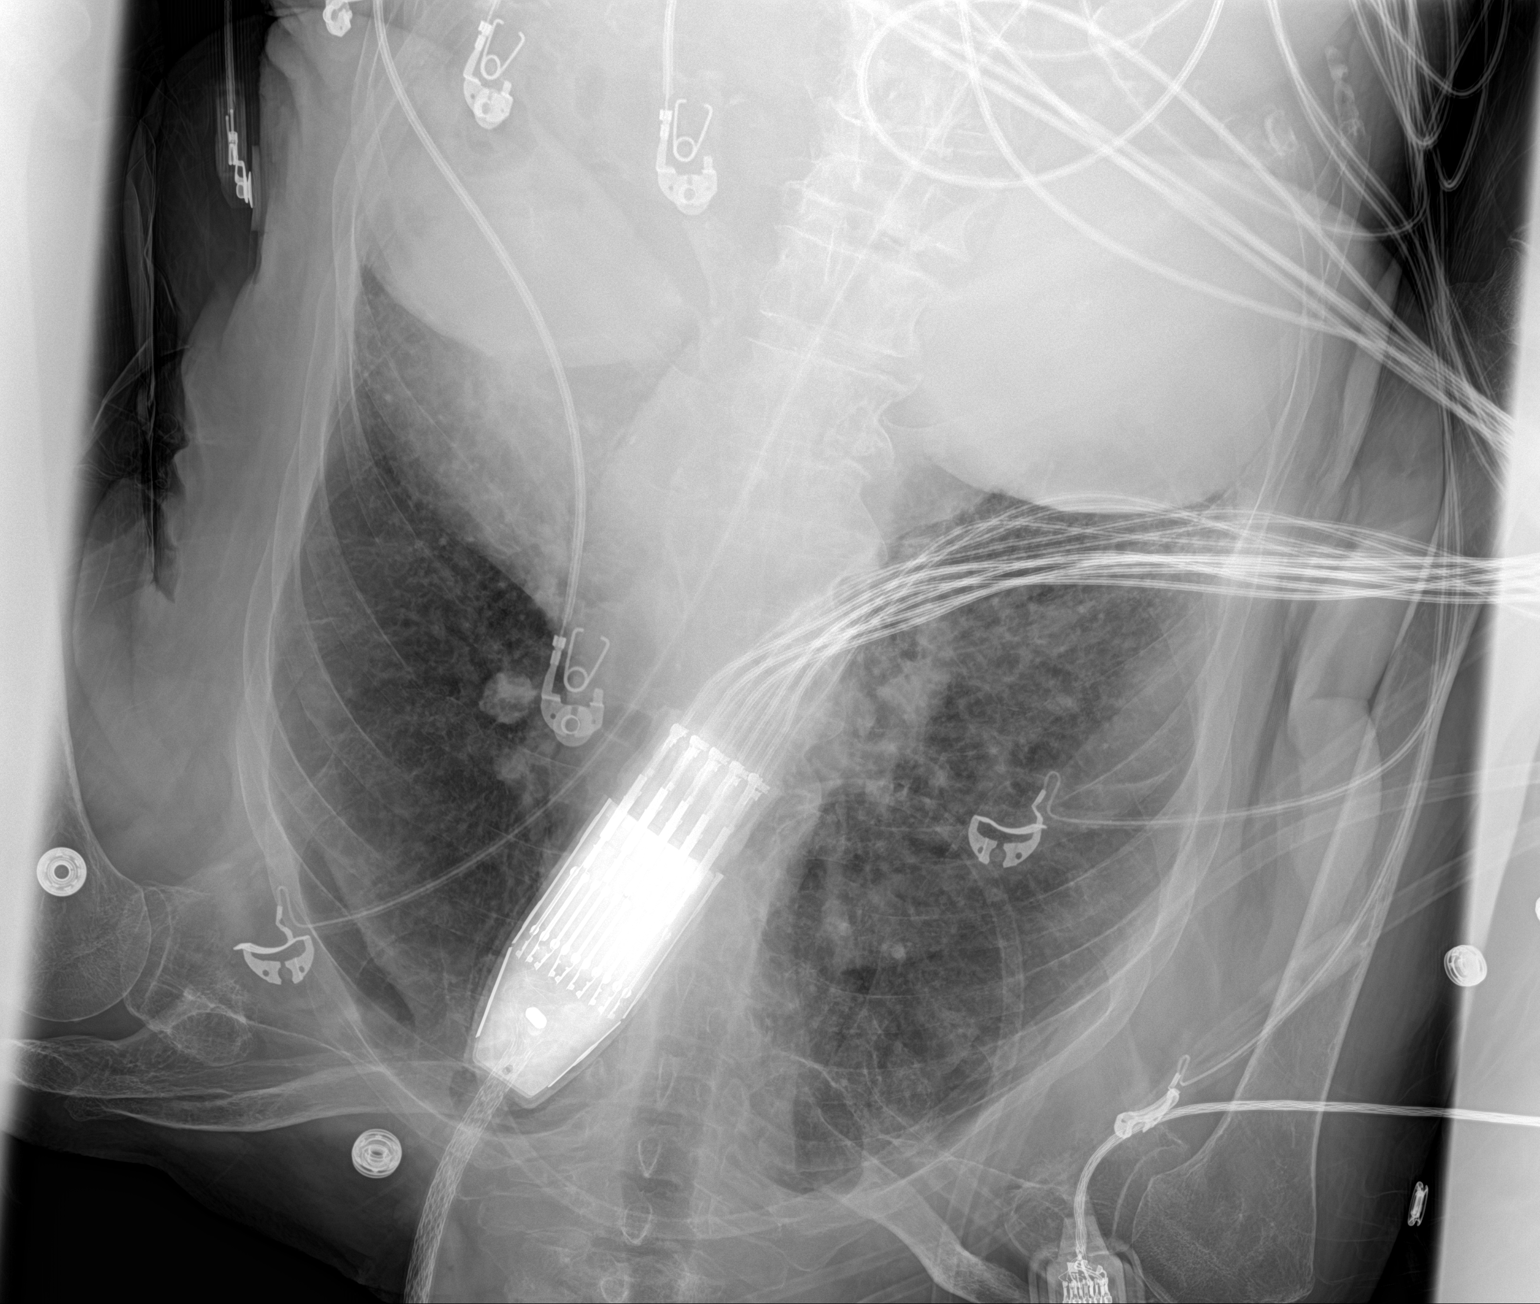

[chest ap (2 of 2)]
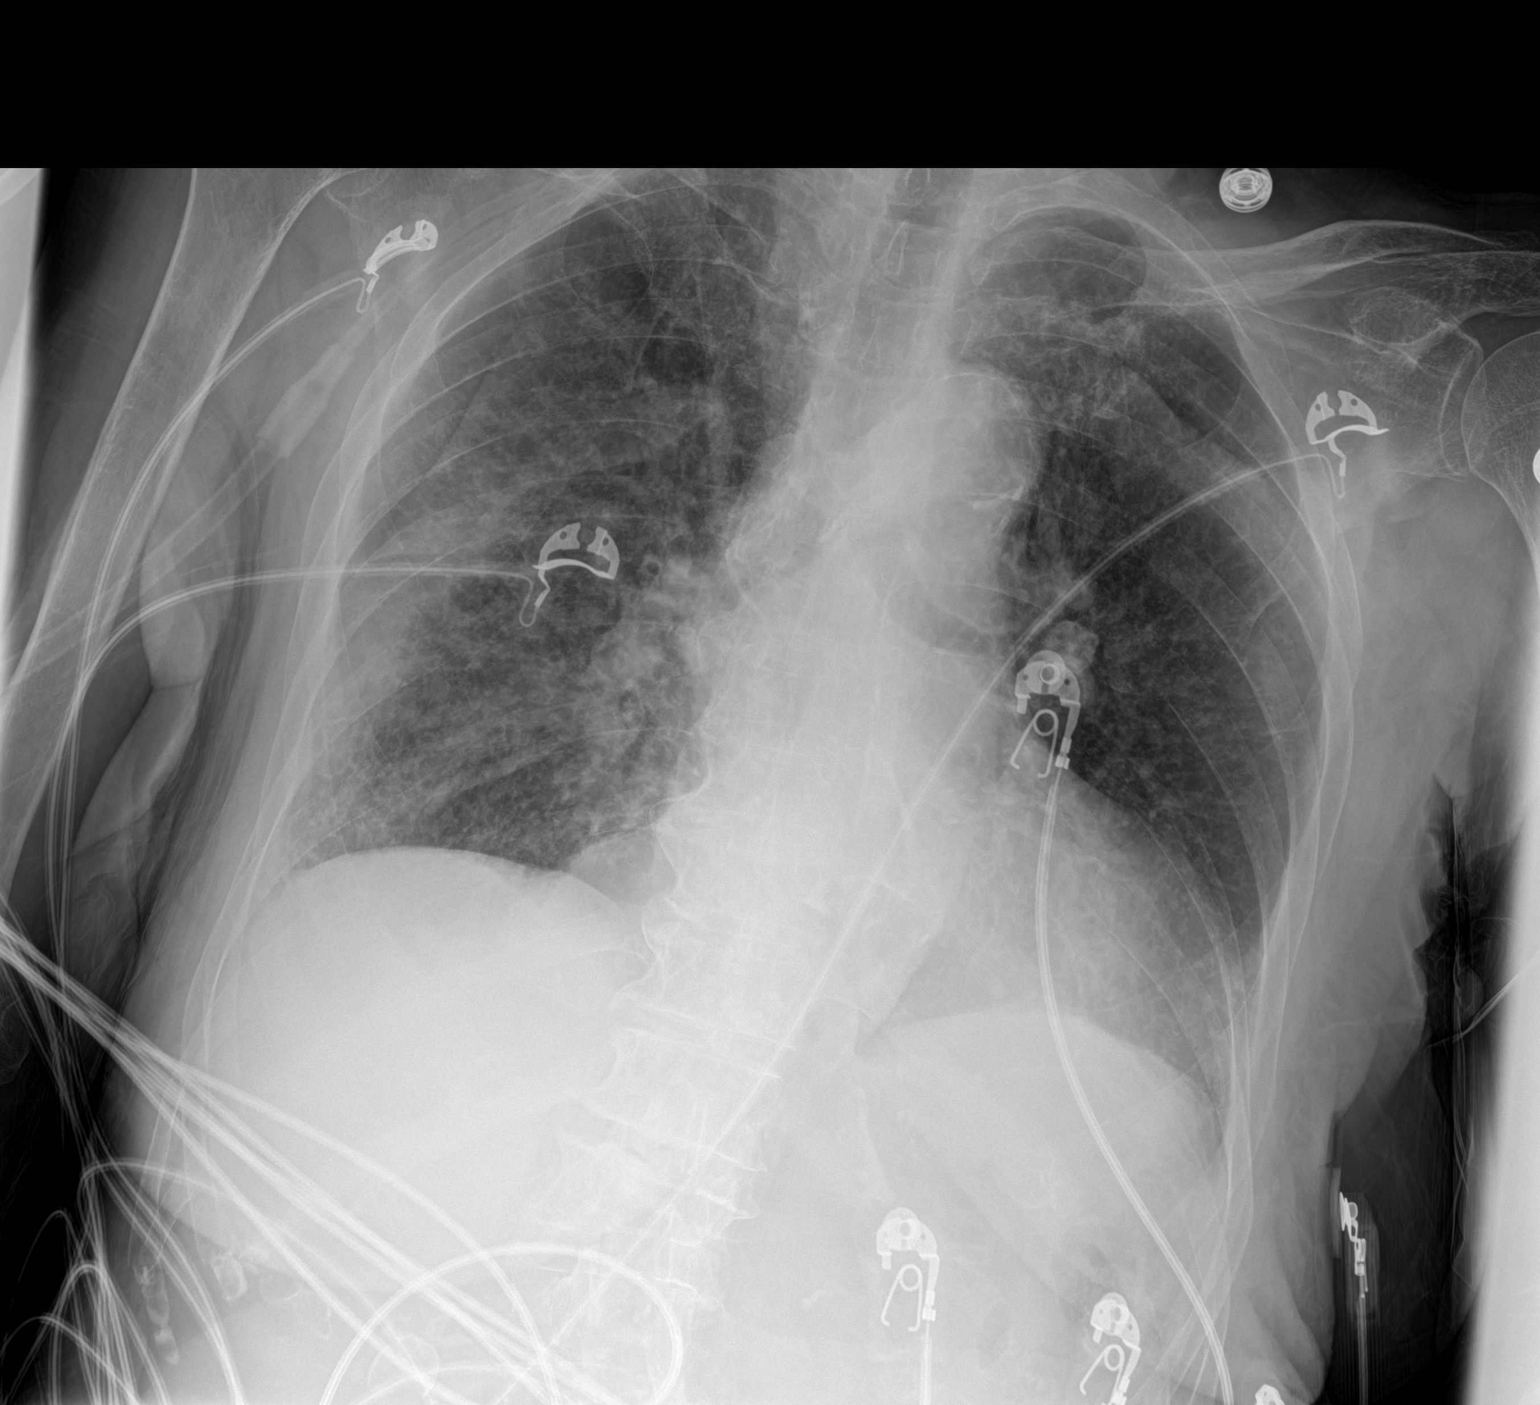

[2 of 2 positions shown; findings below may reference images not displayed]

FINDINGS: Mild cardiomegaly with vascular congestion. Mild interstitial and
alveolar opacity in the right mid to lower lung peripherally. No
significant effusion. Aortic atherosclerosis. No pneumothorax.
IMPRESSION: 1. Interstitial and alveolar opacity in the right mid to lower lung,
may reflect pneumonia
2. There is cardiomegaly and mild vascular congestion.

## 2020-03-10 IMAGING — RF DG SWALLOWING FUNCTION - NRPT [facility]
1 series · 18 of 24 positions shown · non-contrast
Comparison: none

[Series 1: run · 17 acquisitions, 18 frames shown]
[im 1/17]
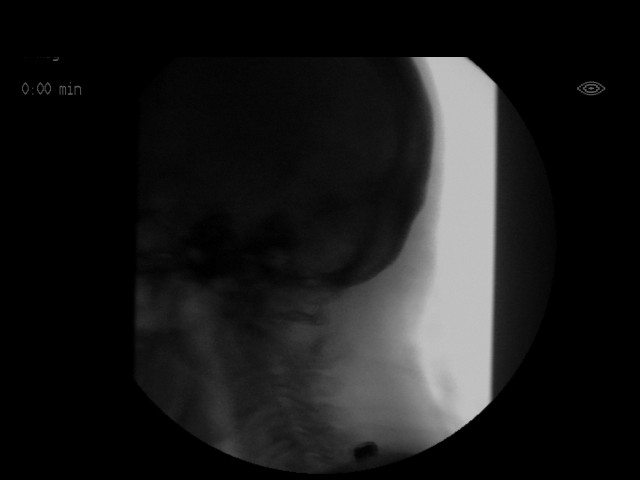
[im 2/17]
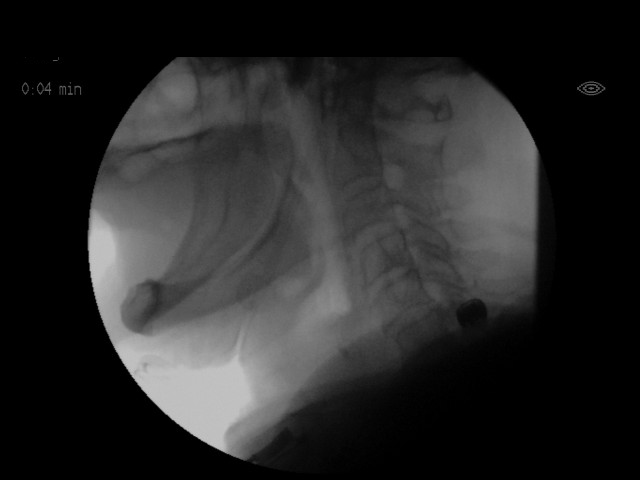
[im 3/17]
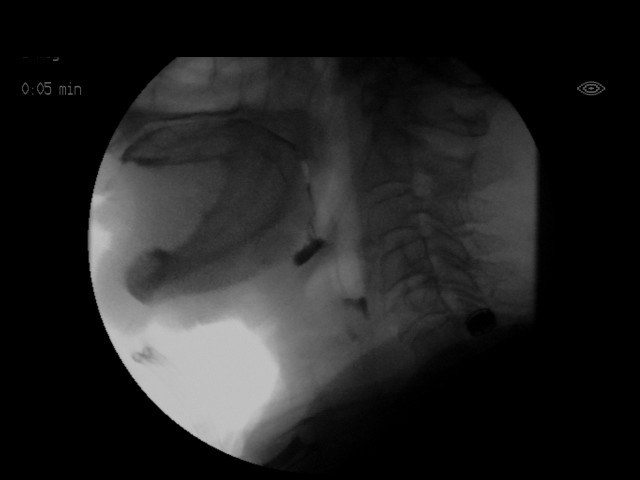
[im 3/17]
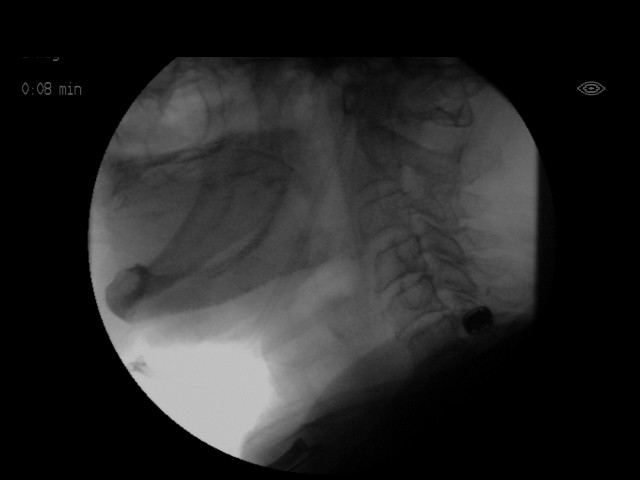
[im 5/17]
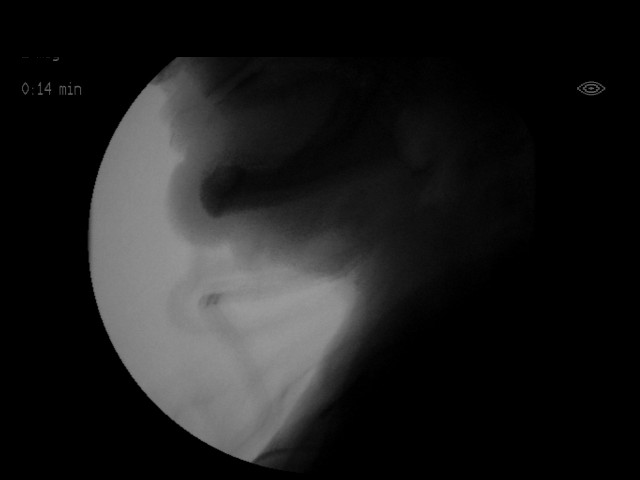
[im 6/17]
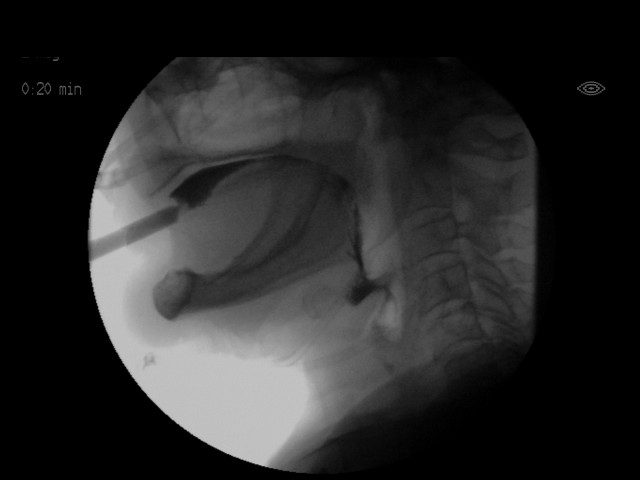
[im 6/17]
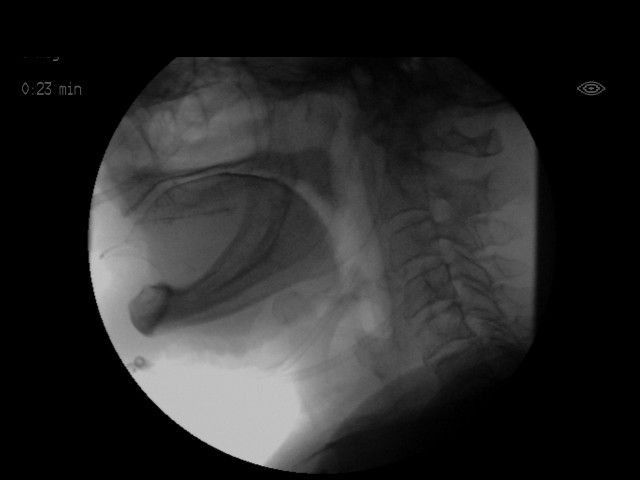
[im 8/17]
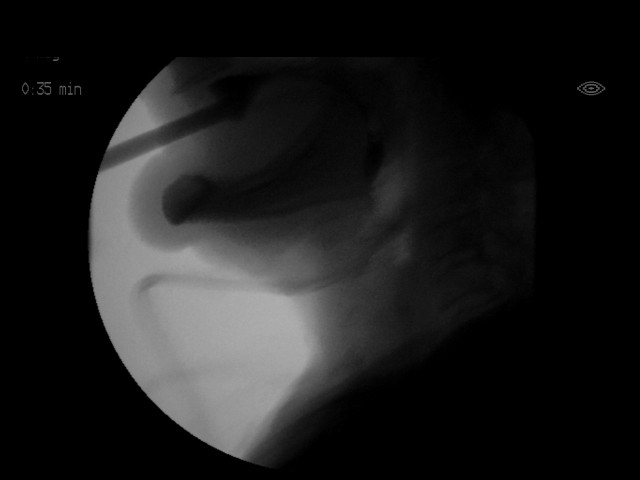
[im 9/17]
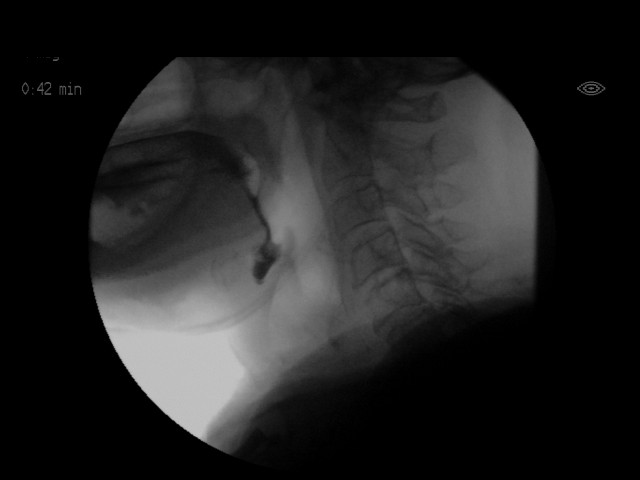
[im 9/17]
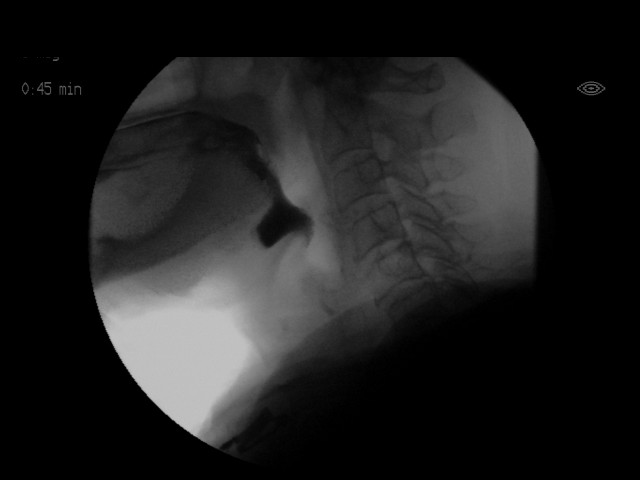
[im 11/17]
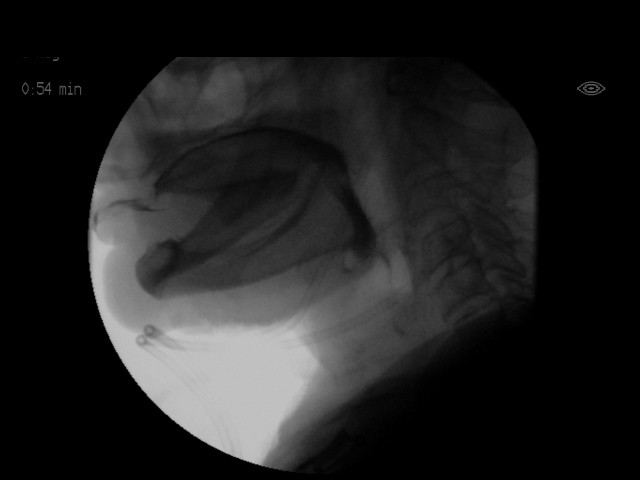
[im 11/17]
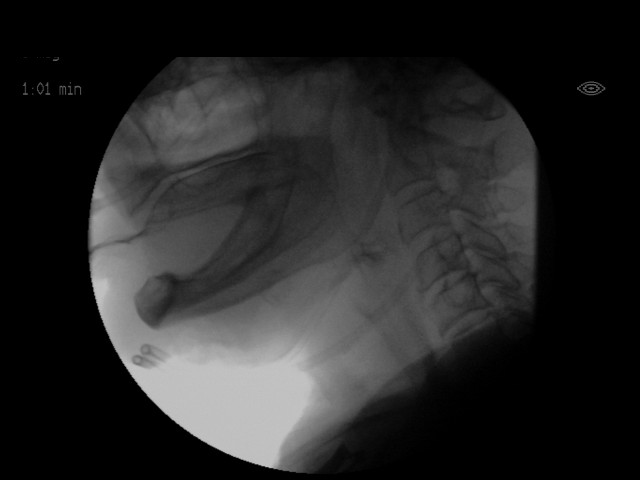
[im 12/17]
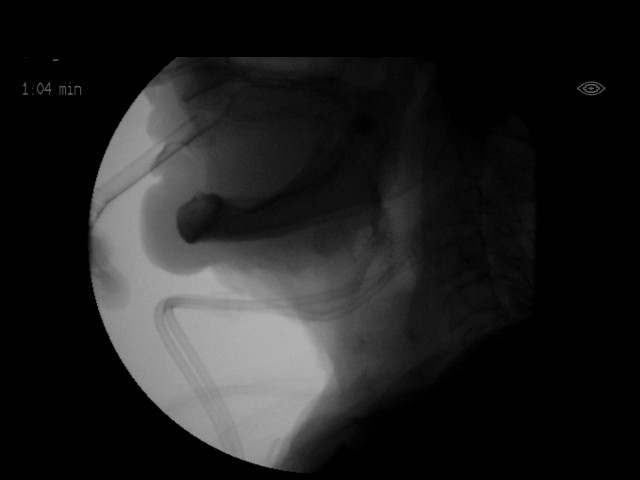
[im 14/17]
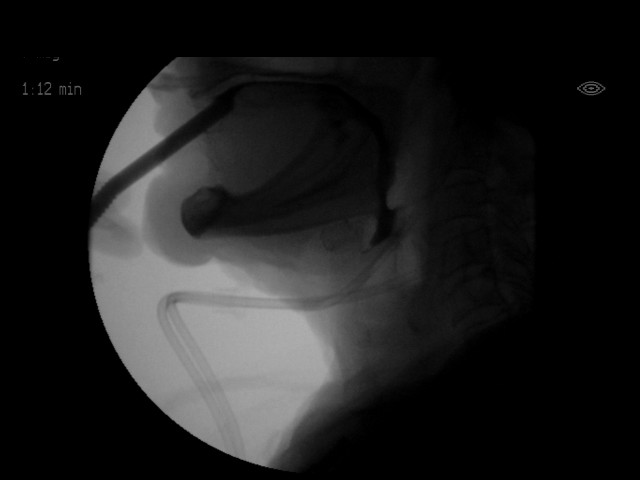
[im 15/17]
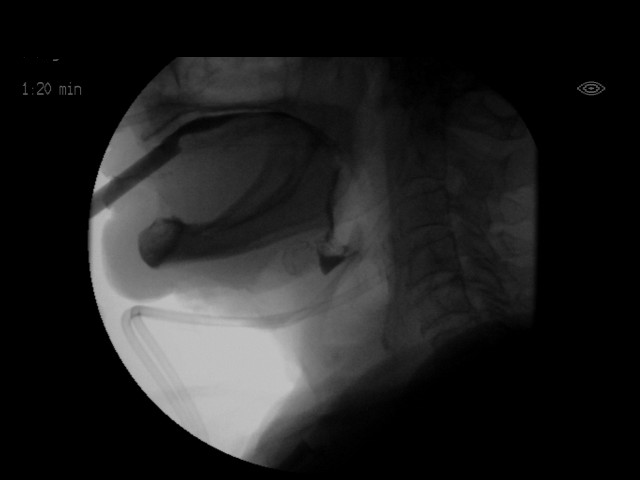
[im 15/17]
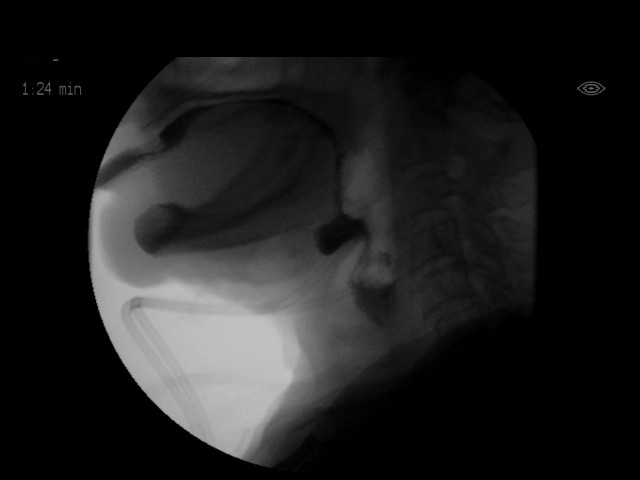
[im 17/17]
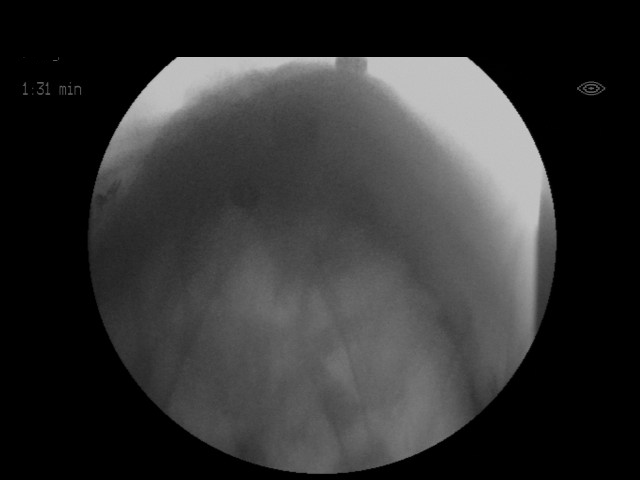
[im 17/17]
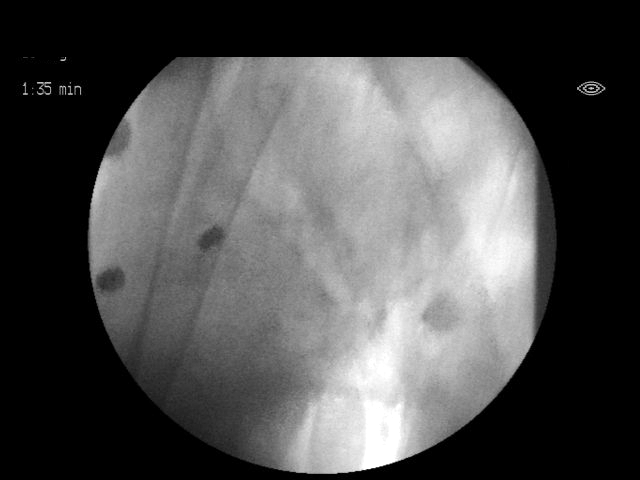

[18 of 24 positions shown; findings below may reference images not displayed]

FLUOROSCOPY FOR SWALLOWING FUNCTION STUDY:
Fluoroscopy was provided for swallowing function study, which was administered by a speech pathologist.  Final results and recommendations from this study are contained within the speech pathology report.
